# Patient Record
Sex: Male | Born: 1965 | Race: Black or African American | Hispanic: No | Marital: Married | Smoking: Never smoker
Health system: Southern US, Community
[De-identification: ages and names within clinical notes are randomized; demographics above are authoritative.]

## PROBLEM LIST (undated history)

## (undated) DIAGNOSIS — Z789 Other specified health status: Secondary | ICD-10-CM

## (undated) HISTORY — PX: KNEE SURGERY: SHX244

---

## 2002-10-13 ENCOUNTER — Encounter: Payer: Self-pay | Admitting: Orthopedic Surgery

## 2002-10-13 ENCOUNTER — Encounter (HOSPITAL_COMMUNITY): Admission: RE | Admit: 2002-10-13 | Discharge: 2002-11-12 | Payer: Self-pay | Admitting: Orthopedic Surgery

## 2006-11-04 ENCOUNTER — Emergency Department (HOSPITAL_COMMUNITY): Admission: EM | Admit: 2006-11-04 | Discharge: 2006-11-04 | Payer: Self-pay | Admitting: Emergency Medicine

## 2011-12-24 DIAGNOSIS — Z87828 Personal history of other (healed) physical injury and trauma: Secondary | ICD-10-CM | POA: Insufficient documentation

## 2011-12-24 DIAGNOSIS — E669 Obesity, unspecified: Secondary | ICD-10-CM | POA: Insufficient documentation

## 2011-12-24 DIAGNOSIS — R030 Elevated blood-pressure reading, without diagnosis of hypertension: Secondary | ICD-10-CM | POA: Insufficient documentation

## 2011-12-24 DIAGNOSIS — N529 Male erectile dysfunction, unspecified: Secondary | ICD-10-CM | POA: Insufficient documentation

## 2013-10-13 ENCOUNTER — Emergency Department (HOSPITAL_COMMUNITY): Payer: Medicare Other

## 2013-10-13 DIAGNOSIS — I959 Hypotension, unspecified: Secondary | ICD-10-CM | POA: Insufficient documentation

## 2013-10-13 DIAGNOSIS — E871 Hypo-osmolality and hyponatremia: Secondary | ICD-10-CM | POA: Insufficient documentation

## 2013-10-13 DIAGNOSIS — N179 Acute kidney failure, unspecified: Secondary | ICD-10-CM | POA: Insufficient documentation

## 2013-10-13 DIAGNOSIS — B9789 Other viral agents as the cause of diseases classified elsewhere: Principal | ICD-10-CM | POA: Insufficient documentation

## 2013-10-13 LAB — CBC
HCT: 42.8 % (ref 39.0–52.0)
HEMOGLOBIN: 15.1 g/dL (ref 13.0–17.0)
MCH: 34 pg (ref 26.0–34.0)
MCHC: 35.3 g/dL (ref 30.0–36.0)
MCV: 96.4 fL (ref 78.0–100.0)
Platelets: 177 10*3/uL (ref 150–400)
RBC: 4.44 MIL/uL (ref 4.22–5.81)
RDW: 13.3 % (ref 11.5–15.5)
WBC: 6.3 10*3/uL (ref 4.0–10.5)

## 2013-10-13 LAB — BASIC METABOLIC PANEL
BUN: 12 mg/dL (ref 6–23)
CALCIUM: 9.1 mg/dL (ref 8.4–10.5)
CO2: 21 mEq/L (ref 19–32)
Chloride: 92 mEq/L — ABNORMAL LOW (ref 96–112)
Creatinine, Ser: 1.41 mg/dL — ABNORMAL HIGH (ref 0.50–1.35)
GFR calc Af Amer: 66 mL/min — ABNORMAL LOW (ref >90)
GFR, EST NON AFRICAN AMERICAN: 57 mL/min — AB (ref >90)
GLUCOSE: 100 mg/dL — AB (ref 70–99)
Potassium: 5.1 mEq/L (ref 3.7–5.3)
SODIUM: 131 meq/L — AB (ref 137–147)

## 2013-10-13 NOTE — ED Notes (Signed)
PT started feeling bad and reports coughing up stuff.  Pt states he took some old antibiotics, got better and then took Benadryl to sleep and reports taking Mucinex and Aleve at the same time.  Pt states he feels lethargic, rash to chest, and red in face, achy all over, and feels sob

## 2013-10-14 ENCOUNTER — Encounter (HOSPITAL_COMMUNITY): Payer: Self-pay | Admitting: Emergency Medicine

## 2013-10-14 ENCOUNTER — Observation Stay (HOSPITAL_COMMUNITY)
Admission: EM | Admit: 2013-10-14 | Discharge: 2013-10-15 | Disposition: A | Discharge status: Home / Self Care | Payer: Medicare Other | Attending: Internal Medicine | Admitting: Internal Medicine

## 2013-10-14 DIAGNOSIS — B349 Viral infection, unspecified: Secondary | ICD-10-CM | POA: Diagnosis present

## 2013-10-14 DIAGNOSIS — E871 Hypo-osmolality and hyponatremia: Secondary | ICD-10-CM

## 2013-10-14 DIAGNOSIS — B9789 Other viral agents as the cause of diseases classified elsewhere: Principal | ICD-10-CM

## 2013-10-14 DIAGNOSIS — I959 Hypotension, unspecified: Secondary | ICD-10-CM

## 2013-10-14 DIAGNOSIS — E860 Dehydration: Secondary | ICD-10-CM

## 2013-10-14 DIAGNOSIS — N179 Acute kidney failure, unspecified: Secondary | ICD-10-CM

## 2013-10-14 HISTORY — DX: Other specified health status: Z78.9

## 2013-10-14 LAB — URINALYSIS, ROUTINE W REFLEX MICROSCOPIC
Bilirubin Urine: NEGATIVE
Glucose, UA: NEGATIVE mg/dL
Hgb urine dipstick: NEGATIVE
Ketones, ur: NEGATIVE mg/dL
Leukocytes, UA: NEGATIVE
Nitrite: NEGATIVE
Protein, ur: 30 mg/dL — AB
Specific Gravity, Urine: 1.024 (ref 1.005–1.030)
Urobilinogen, UA: 0.2 mg/dL (ref 0.0–1.0)
pH: 6.5 (ref 5.0–8.0)

## 2013-10-14 LAB — INFLUENZA PANEL BY PCR (TYPE A & B)
H1N1FLUPCR: NOT DETECTED
INFLBPCR: NEGATIVE
Influenza A By PCR: NEGATIVE

## 2013-10-14 LAB — I-STAT CG4 LACTIC ACID, ED: LACTIC ACID, VENOUS: 0.72 mmol/L (ref 0.5–2.2)

## 2013-10-14 LAB — URINE MICROSCOPIC-ADD ON

## 2013-10-14 LAB — CBC
HCT: 42.3 % (ref 39.0–52.0)
Hemoglobin: 14.7 g/dL (ref 13.0–17.0)
MCH: 33.8 pg (ref 26.0–34.0)
MCHC: 34.8 g/dL (ref 30.0–36.0)
MCV: 97.2 fL (ref 78.0–100.0)
Platelets: 159 10*3/uL (ref 150–400)
RBC: 4.35 MIL/uL (ref 4.22–5.81)
RDW: 13.3 % (ref 11.5–15.5)
WBC: 4 10*3/uL (ref 4.0–10.5)

## 2013-10-14 LAB — CREATININE, SERUM
Creatinine, Ser: 1.3 mg/dL (ref 0.50–1.35)
GFR calc Af Amer: 73 mL/min — ABNORMAL LOW (ref >90)
GFR calc non Af Amer: 63 mL/min — ABNORMAL LOW (ref >90)

## 2013-10-14 MED ORDER — SODIUM CHLORIDE 0.9 % IV BOLUS (SEPSIS)
1000.0000 mL | Freq: Once | INTRAVENOUS | Status: AC
  Administered 2013-10-14: 1000 mL via INTRAVENOUS

## 2013-10-14 MED ORDER — ENOXAPARIN SODIUM 40 MG/0.4ML ~~LOC~~ SOLN
40.0000 mg | SUBCUTANEOUS | Status: DC
  Filled 2013-10-14 (×2): qty 0.4

## 2013-10-14 MED ORDER — ACETAMINOPHEN 650 MG RE SUPP: 650.0000 mg | Freq: Four times a day (QID) | RECTAL | Status: DC | PRN

## 2013-10-14 MED ORDER — ONDANSETRON HCL 4 MG PO TABS: 4.0000 mg | ORAL_TABLET | Freq: Four times a day (QID) | ORAL | Status: DC | PRN

## 2013-10-14 MED ORDER — SODIUM CHLORIDE 0.9 % IV SOLN
INTRAVENOUS | Status: DC
  Administered 2013-10-14: 20:00:00 via INTRAVENOUS

## 2013-10-14 MED ORDER — ALUM & MAG HYDROXIDE-SIMETH 200-200-20 MG/5ML PO SUSP: 30.0000 mL | Freq: Four times a day (QID) | ORAL | Status: DC | PRN

## 2013-10-14 MED ORDER — ACETAMINOPHEN 325 MG PO TABS: 650.0000 mg | ORAL_TABLET | Freq: Four times a day (QID) | ORAL | Status: DC | PRN

## 2013-10-14 MED ORDER — IBUPROFEN 200 MG PO TABS
600.0000 mg | ORAL_TABLET | Freq: Once | ORAL | Status: AC
  Administered 2013-10-14: 600 mg via ORAL
  Filled 2013-10-14: qty 3

## 2013-10-14 MED ORDER — DIPHENHYDRAMINE HCL 25 MG PO CAPS
25.0000 mg | ORAL_CAPSULE | Freq: Once | ORAL | Status: AC
  Administered 2013-10-14: 25 mg via ORAL
  Filled 2013-10-14: qty 1

## 2013-10-14 MED ORDER — OXYCODONE HCL 5 MG PO TABS
5.0000 mg | ORAL_TABLET | ORAL | Status: DC | PRN
  Administered 2013-10-14: 5 mg via ORAL
  Filled 2013-10-14: qty 1

## 2013-10-14 MED ORDER — ONDANSETRON HCL 4 MG/2ML IJ SOLN: 4.0000 mg | Freq: Four times a day (QID) | INTRAMUSCULAR | Status: DC | PRN

## 2013-10-14 NOTE — ED Notes (Signed)
Pt moved to Pod C. Report given to [place]erm[place]y, [place]

## 2013-10-14 NOTE — H&P (Signed)
[facility] History and Physical  Darren MaeWilliam H Faller ZOX:[id number] DOB: May 20, 1966 DOA: 10/14/2013  Referring physician:  PCP: No primary provider on file.   Chief Complaint: Generalized weakness  HPI: Darren Watson is a 48 y.o. male with no significant past medical history presenting to the emergency room with complaints of generalized weakness, low-grade temperature, chills, malaise, fatigue, dizziness/lightheadedness, cough, mild shortness of breath. He also complains of nonbloody diarrhea and mild generalized abdominal pain. mptoms have been present over the past week for which he took some "old antibiotics." He states feeling worse despite taking antimicrobial therapy. He also reports developing a rash over his face and trunk after taking antibiotics for which she took Benadryl. Patient cannot recall the name of the antibiotic. Workup in the emergency department included a chest x-ray which showed minimal right basilar atelectasis, no infiltrate or effusion noted. Labs revealed the presence of acute renal failure with creatinine 1.4 as well a sodium of 131. Lactic acid at 0.72 with CBC showing white count of 6300. He reports several recent trips taking his son to football games, although denies sick contacts.                                                                           Review of Systems:  Constitutional:  No weight loss, night sweats, positive for fevers, chills, fatigue.  HEENT:  No headaches, Difficulty swallowing,Tooth/dental problems,Sore throat,  No sneezing, itching, ear ache, nasal congestion, post nasal drip,  Cardio-vascular:  No chest pain, Orthopnea, PND, swelling in lower extremities, anasarca, dizziness, palpitations  GI:  No heartburn, indigestion, abdominal pain, nausea, vomiting, diarrhea, change in bowel habits, loss of appetite  Resp:  Positive for shortness of breath with exertion or at rest. No excess mucus, no productive cough, positive for  non-productive cough, No coughing up of blood.No change in color of mucus.No wheezing.No chest wall deformity  Skin:  no rash or lesions.  GU:  no dysuria, change in color of urine, no urgency or frequency. No flank pain.  Musculoskeletal:  No joint pain or swelling. No decreased range of motion. No back pain.  Psych:  No change in mood or affect. No depression or anxiety. No memory loss.   No past medical history on file. No past surgical history on file. Social History:  has no tobacco, alcohol, and drug history on file.  No Known Allergies  No family history on file.   Prior to Admission medications   Medication Sig Start Date End Date Taking? Authorizing Provider  diphenhydrAMINE (BENADRYL) 25 MG tablet Take 25 mg by mouth every 6 (six) hours as needed.   Yes Historical Provider, MD  naproxen sodium (ANAPROX) 220 MG tablet Take 220 mg by mouth daily as needed (for pain).   Yes Historical Provider, MD  Phenylephrine-DM-GG-APAP (MUCINEX FAST-MAX) 10-20-400-650 MG PACK Take 30 mLs by mouth daily as needed (for cold).   Yes Historical Provider, MD   Physical Exam: Filed Vitals:   10/14/13 0800  BP: 102/70  Pulse: 83  Temp:   Resp:     BP 102/70  Pulse 83  Temp(Src) 99.3 F (37.4 C) (Oral)  Resp 18  Wt 95.255 kg (210 lb)  SpO2 97%  General:  Ill-appearing, in no acute distress  Eyes: PERRL, normal lids, irises & conjunctiva ENT: grossly normal hearing, dry oral mucosa Neck: no LAD, masses or thyromegaly Cardiovascular: RRR, no m/r/g. No LE edema. Telemetry: SR, no arrhythmias  Respiratory: CTA bilaterally, no w/r/r. Normal respiratory effort. Abdomen: soft, ntnd Skin: no rash or induration seen on limited exam Musculoskeletal: grossly normal tone BUE/BLE Psychiatric: grossly normal mood and affect, speech fluent and appropriate Neurologic: grossly non-focal.          Labs on Admission:  Basic Metabolic Panel:  Recent Labs Lab 10/13/13 2121  NA 131*  K 5.1   CL 92*  CO2 21  GLUCOSE 100*  BUN 12  CREATININE 1.41*  CALCIUM 9.1   Liver Function Tests: No results found for this basename: AST, ALT, ALKPHOS, BILITOT, PROT, ALBUMIN,  in the last 168 hours No results found for this basename: LIPASE, AMYLASE,  in the last 168 hours No results found for this basename: AMMONIA,  in the last 168 hours CBC:  Recent Labs Lab 10/13/13 2121  WBC 6.3  HGB 15.1  HCT 42.8  MCV 96.4  PLT 177   Cardiac Enzymes: No results found for this basename: CKTOTAL, CKMB, CKMBINDEX, TROPONINI,  in the last 168 hours  BNP (last 3 results) No results found for this basename: PROBNP,  in the last 8760 hours CBG: No results found for this basename: GLUCAP,  in the last 168 hours  Radiological Exams on Admission: Dg Chest 2 View  10/13/2013   CLINICAL DATA:  Shortness of breath  EXAM: CHEST  2 VIEW  COMPARISON:  None.  FINDINGS: Cardiac shadow is within normal limits. Minimal right basilar atelectasis is noted. No sizable infiltrate or effusion is noted. No bony abnormality is seen.  IMPRESSION: Right basilar atelectasis.   Electronically Signed   By: Alcide CleverMark  Lukens M.D.   On: 10/13/2013 21:56    EKG: Independently reviewed.   Assessment/Plan Principal Problem:   Viral illness Active Problems:   Dehydration   Hypotension   AKI (acute kidney injury)   Hyponatremia   1. Suspected viral illness. Patient presenting with clinical signs symptoms suggestive of viral illness. Initial chest x-ray unremarkable, having a normal white count and lactic acid level. Patient appearing dehydrated, will place him in overnight observation and hydrate him with IV fluids. Will check blood cultures and flu swab. Provide supportive care, repeat lab work in a.m. 2. Hypotension. In the emergency room patient having low blood pressures of 103/42, I suspect secondary to dehydration. Providing IV fluid resuscitation. Monitor vitals.  3. Acute kidney injury. Lab work showing a  creatinine of 1.41, likely secondary to prerenal azotemia. Hydrate with normal saline 4. Hyponatremia. Patient's having a sodium of 131 on initial lab work, likely secondary to hypotonic, hypovolemic, hyponatremia. Receiving IV fluids 5. DVT prophylaxis. Subcutaneous Lovenox  Code Status: Full code Family Communication:  Disposition Plan: Admitting patient to office, I do not anticipate him requiring greater than 2 nights hospitalization  Time spent: 55 minutes  Jeralyn BennettEzequiel Zeyna Mkrtchyan MD [facility] Pager [phone]

## 2013-10-14 NOTE — ED Notes (Signed)
Pt reports he does not feel any better.

## 2013-10-14 NOTE — ED Notes (Signed)
Pt asked multiple questions:  "why do I hurt?"  "can the dr give me something for a cough?"  "why am I chilled?"  Education given.

## 2013-10-14 NOTE — ED Provider Notes (Signed)
CSN: [id number]     Arrival date & time 10/13/13  2045 History   First MD Initiated Contact with Patient 10/14/13 0035     Chief Complaint  Patient presents with  . Fever  . Rash    itchy ? Hives     (Consider location/radiation/quality/duration/timing/severity/associated sxs/prior Treatment) HPI Patient has had several days of generalized fatigue, nonproductive cough and fever. He states he's been taking some old antibiotics. Over the last day he has developed a diffuse red rash to face and trunk. He has some mild shortness of breath. He continues to have a low-grade fever. He states he just feels sick. He taking the next and Aleve at home. He denies any focal weakness or numbness. He has no neck stiffness. He has no abdominal pain. He has no nausea, vomiting or diarrhea. He denies any urinary symptoms. No past medical history on file. No past surgical history on file. No family history on file. History  Substance Use Topics  . Smoking status: Not on file  . Smokeless tobacco: Not on file  . Alcohol Use: Not on file    Review of Systems  Constitutional: Positive for fever, chills and fatigue.  HENT: Negative for congestion, rhinorrhea and sore throat.   Respiratory: Positive for cough and shortness of breath.   Cardiovascular: Negative for chest pain, palpitations and leg swelling.  Gastrointestinal: Negative for nausea, vomiting and abdominal pain.  Genitourinary: Negative for dysuria.  Musculoskeletal: Negative for back pain, myalgias, neck pain and neck stiffness.  Skin: Positive for rash. Negative for wound.  Neurological: Negative for dizziness, weakness, light-headedness, numbness and headaches.  All other systems reviewed and are negative.     Allergies  Review of patient's allergies indicates no known allergies.  Home Medications   Current Outpatient Rx  Name  Route  Sig  Dispense  Refill  . diphenhydrAMINE (BENADRYL) 25 MG tablet   Oral   Take 25 mg by mouth  every 6 (six) hours as needed.         . naproxen sodium (ANAPROX) 220 MG tablet   Oral   Take 220 mg by mouth daily as needed (for pain).         . Phenylephrine-DM-GG-APAP (MUCINEX FAST-MAX) 10-20-400-650 MG PACK   Oral   Take 30 mLs by mouth daily as needed (for cold).          BP 100/40  Pulse 81  Temp(Src) 99.8 F (37.7 C) (Oral)  Resp 18  Wt 210 lb (95.255 kg)  SpO2 96% Physical Exam  Nursing note and vitals reviewed. Constitutional: He is oriented to person, place, and time. He appears well-developed and well-nourished. No distress.  HENT:  Head: Normocephalic and atraumatic.  Mouth/Throat: Oropharynx is clear and moist.  Patient has several posterior scalp nodules. There is no tenderness, fluctuance or warmth.  Eyes: EOM are normal. Pupils are equal, round, and reactive to light.  Neck: Normal range of motion. Neck supple.  No meningismus  Cardiovascular: Normal rate and regular rhythm.   Pulmonary/Chest: Effort normal and breath sounds normal. No respiratory distress. He has no wheezes. He has no rales. He exhibits no tenderness.  Abdominal: Soft. Bowel sounds are normal. He exhibits no distension and no mass. There is no tenderness. There is no rebound and no guarding.  Musculoskeletal: Normal range of motion. He exhibits no edema and no tenderness.  Neurological: He is alert and oriented to person, place, and time.  Patient is alert and oriented x3 with  clear, goal oriented speech. Patient has 5/5 motor in all extremities. Sensation is intact to light touch. Bilateral finger-to-nose is normal with no signs of dysmetria. Patient has a normal gait and walks without assistance.   Skin: Skin is warm and dry. Rash noted. No erythema.  Patient has fine lacelike macular erythematous rash covering trauma and extremities.  Psychiatric: He has a normal mood and affect. His behavior is normal.    ED Course  Procedures (including critical care time) Labs Review Labs  Reviewed  BASIC METABOLIC PANEL - Abnormal; Notable for the following:    Sodium 131 (*)    Chloride 92 (*)    Glucose, Bld 100 (*)    Creatinine, Ser 1.41 (*)    GFR calc non Af Amer 57 (*)    GFR calc Af Amer 66 (*)    All other components within normal limits  CBC  URINALYSIS, ROUTINE W REFLEX MICROSCOPIC   Imaging Review Dg Chest 2 View  10/13/2013   CLINICAL DATA:  Shortness of breath  EXAM: CHEST  2 VIEW  COMPARISON:  None.  FINDINGS: Cardiac shadow is within normal limits. Minimal right basilar atelectasis is noted. No sizable infiltrate or effusion is noted. No bony abnormality is seen.  IMPRESSION: Right basilar atelectasis.   Electronically Signed   By: Alcide CleverMark  Lukens M.D.   On: 10/13/2013 21:56     EKG Interpretation None      MDM   Final diagnoses:  None   patient continues to have borderline hypotension despite IV fluids. Continues to have mild lightheadedness when sitting or standing. His lactic acid is normal he has a normal white blood cell count. I suspect his symptoms are likely related to some type of viral infection. I discussed with Dr. Dawayne Patriciafeet of [facility]. She will see the patient in emergency department and assist with disposition.      Loren Raceravid Mikinzie Maciejewski, MD 10/14/13 [phone]

## 2013-10-14 NOTE — Progress Notes (Signed)
Darren MaeWilliam H Strohecker [id number] Code Status: FULL  Admission Data: 10/14/2013 6:05 PM Attending Provider: Vanessa BarbaraZamora PCP:No primary provider on file. Consults/ Treatment Team:  [facility]  Darren Watson is a 48 y.o. male patient admitted from ED awake, alert - oriented  X 3 - no acute distress noted.  VSS - Blood pressure 113/61, pulse 79, temperature 99.2 F (37.3 C), temperature source Oral, resp. rate 18, height 5\' 9"  (1.753 m), weight 95.482 kg (210 lb 8 oz), SpO2 100.00%.  no c/o shortness of breath, no c/o chest pain.  IV Fluids:  IV in place, occlusive dsg intact without redness normal saline.  Allergies:  No Known Allergies   History reviewed. No pertinent past medical history. Medications Prior to Admission  Medication Sig Dispense Refill  . diphenhydrAMINE (BENADRYL) 25 MG tablet Take 25 mg by mouth every 6 (six) hours as needed.      . naproxen sodium (ANAPROX) 220 MG tablet Take 220 mg by mouth daily as needed (for pain).      . Phenylephrine-DM-GG-APAP (MUCINEX FAST-MAX) 10-20-400-650 MG PACK Take 30 mLs by mouth daily as needed (for cold).       History:  obtained from the patient.  Orientation to room, and floor completed with information packet given to patient/family.  Patient declined safety video at this time.  Admission INP armband ID verified with patient/family, and in place.   SR up x 2, fall assessment complete, with patient and family able to verbalize understanding of risk associated with falls, and verbalized understanding to call nsg before up out of bed.  Call light within reach, patient able to voice, and demonstrate understanding.  Skin, clean-dry- intact without evidence of bruising, or skin tears.   No evidence of skin break down noted on exam.     Will cont to eval and treat per MD orders.  Aldean AstRachel C Lesperance, RN 10/14/2013 6:05 PM

## 2013-10-14 NOTE — Progress Notes (Signed)
4:56 PM Attempted to receive report  1705: Report received from ED RN

## 2013-10-15 LAB — BASIC METABOLIC PANEL
BUN: 10 mg/dL (ref 6–23)
CHLORIDE: 98 meq/L (ref 96–112)
CO2: 24 meq/L (ref 19–32)
Calcium: 8.3 mg/dL — ABNORMAL LOW (ref 8.4–10.5)
Creatinine, Ser: 1.24 mg/dL (ref 0.50–1.35)
GFR calc Af Amer: 77 mL/min — ABNORMAL LOW (ref >90)
GFR calc non Af Amer: 67 mL/min — ABNORMAL LOW (ref >90)
GLUCOSE: 97 mg/dL (ref 70–99)
Potassium: 4.6 mEq/L (ref 3.7–5.3)
SODIUM: 133 meq/L — AB (ref 137–147)

## 2013-10-15 LAB — CBC
HEMATOCRIT: 39.8 % (ref 39.0–52.0)
HEMOGLOBIN: 13.6 g/dL (ref 13.0–17.0)
MCH: 33.7 pg (ref 26.0–34.0)
MCHC: 34.2 g/dL (ref 30.0–36.0)
MCV: 98.5 fL (ref 78.0–100.0)
Platelets: 154 10*3/uL (ref 150–400)
RBC: 4.04 MIL/uL — AB (ref 4.22–5.81)
RDW: 13.4 % (ref 11.5–15.5)
WBC: 4.3 10*3/uL (ref 4.0–10.5)

## 2013-10-15 MED ORDER — FLUTICASONE PROPIONATE 50 MCG/ACT NA SUSP: 1.0000 | Freq: Every day | NASAL | Status: DC

## 2013-10-15 MED ORDER — ACETAMINOPHEN 325 MG PO TABS: 650.0000 mg | ORAL_TABLET | Freq: Four times a day (QID) | ORAL | Status: DC | PRN

## 2013-10-15 NOTE — Discharge Summary (Signed)
Addendum  Patient seen and examined, chart and data base reviewed.  I agree with the above assessment and plan.  For full details please see Mrs. Algis DownsMarianne York PA note.  Viral syndrome and acute renal failure which is resolved after hydration.   Clint LippsMutaz A Sharley Keeler, MD [facility] Pager: [phone] 10/15/2013, 5:59 PM

## 2013-10-15 NOTE — Discharge Instructions (Signed)
Establish with a primary care physician. Avoid NSAIDS (Naproxsyn, Advil, Ibuprofen).  Use Tylenol instead.

## 2013-10-15 NOTE — Progress Notes (Signed)
Nsg Discharge Note  Admit Date:  10/14/2013 Discharge date: 10/15/2013   Darren Watson to be D/C'd Home per MD order.  AVS completed.  Copy for chart, and copy for patient signed, and dated. Patient/caregiver able to verbalize understanding.  Discharge Medication:   Medication List    STOP taking these medications       naproxen sodium 220 MG tablet  Commonly known as:  ANAPROX      TAKE these medications       acetaminophen 325 MG tablet  Commonly known as:  TYLENOL  Take 2 tablets (650 mg total) by mouth every 6 (six) hours as needed for mild pain (or Fever >/= 101).     diphenhydrAMINE 25 MG tablet  Commonly known as:  BENADRYL  Take 25 mg by mouth every 6 (six) hours as needed.     fluticasone 50 MCG/ACT nasal spray  Commonly known as:  FLONASE  Place 1 spray into both nostrils daily.     MUCINEX FAST-MAX 10-20-400-650 MG Pack  Generic drug:  Phenylephrine-DM-GG-APAP  Take 30 mLs by mouth daily as needed (for cold).        Discharge Assessment: Filed Vitals:   10/15/13 1439  BP: 123/71  Pulse: 79  Temp: 98.3 F (36.8 C)  Resp: 20   Skin clean, dry and intact without evidence of skin break down, no evidence of skin tears noted. IV catheter discontinued intact. Site without signs and symptoms of complications - no redness or edema noted at insertion site, patient denies c/o pain - only slight tenderness at site.  Dressing with slight pressure applied.  D/c Instructions-Education: Discharge instructions given to patient/family with verbalized understanding. D/c education completed with patient/family including follow up instructions, medication list, d/c activities limitations if indicated, with other d/c instructions as indicated by MD - patient able to verbalize understanding, all questions fully answered. Patient instructed to return to ED, call 911, or call MD for any changes in condition.  Patient escorted via WC, and D/C home via private auto.  Darren Watson  Darren Jelinski, RN 10/15/2013 4:18 PM

## 2013-10-15 NOTE — Discharge Summary (Signed)
Physician Discharge Summary  Darren MaeWilliam H Kratochvil ZOX:[id number] DOB: 06-23-1966 DOA: 10/14/2013  PCP: No primary provider on file[facility]  Admit date: 10/14/2013 Discharge date: 10/15/2013  Time spent: 45 minutes  Recommendations for Outpatient Follow-up:  Follow up with a primary care physician as needed.   Discharge Diagnoses:  Principal Problem:   Viral illness Active Problems:   Dehydration   Hypotension   AKI (acute kidney injury)   Hyponatremia   Discharge Condition: stable.  Diet recommendation: heart healthy  Filed Weights   10/13/13 2118 10/14/13 1749  Weight: 95.255 kg (210 lb) 95.482 kg (210 lb 8 oz)    History of present illness:   Darren Watson is a 48 y.o. male with no significant past medical history presenting to the emergency room with complaints of generalized weakness, low-grade temperature, chills, malaise, fatigue, dizziness/lightheadedness, cough, mild shortness of breath. He also complains of nonbloody diarrhea and mild generalized abdominal pain. symptoms have been present over the past week for which he took some "old antibiotics.". He also reports developing a rash over his face and trunk after taking antibiotics for which she took Benadryl. Patient cannot recall the name of the antibiotic. Workup in the emergency department included a chest x-ray which showed minimal right basilar atelectasis, no infiltrate or effusion noted. Labs revealed the presence of acute renal failure with creatinine 1.4 as well a sodium of 131.   He reports several recent trips taking his son to football games, although denies sick contacts.   Hospital Course:   Acute renal failure Likely secondary to viral syndrome  Discontinued NSAIDS U/A is negative for infection IV Fluids Creatinine has normalized after receiving IVF.  Mild hyponatremia Trending up with oral diet and IVF.  Viral Syndrome Influenza PCR is negative Vital signs stable. No elevation of  WBC Eating well.  No complaints of body aches or malaise.  Discharge Exam: Filed Vitals:   10/15/13 0607  BP: 108/69  Pulse: 71  Temp: 98.8 F (37.1 C)  Resp: 20    General: Alert and Orientated, WN, WD, Appears well ENT:  MMM, No exudates or erythema in oropharynx Neck:  Supple no Lymphadenopathy Cardiovascular: RRR no M/R/G Respiratory: CTA no W/C/R Abdomen:  Soft, Nt, Nd, +Bs, no masses Extremities:  No C/C/E, ambulating about the room Skin:  No rash.        Discharge Orders   Future Orders Complete By Expires   Diet - low sodium heart healthy  As directed    Increase activity slowly  As directed        Medication List    STOP taking these medications       naproxen sodium 220 MG tablet  Commonly known as:  ANAPROX      TAKE these medications       acetaminophen 325 MG tablet  Commonly known as:  TYLENOL  Take 2 tablets (650 mg total) by mouth every 6 (six) hours as needed for mild pain (or Fever >/= 101).     diphenhydrAMINE 25 MG tablet  Commonly known as:  BENADRYL  Take 25 mg by mouth every 6 (six) hours as needed.     fluticasone 50 MCG/ACT nasal spray  Commonly known as:  FLONASE  Place 1 spray into both nostrils daily.     MUCINEX FAST-MAX 10-20-400-650 MG Pack  Generic drug:  Phenylephrine-DM-GG-APAP  Take 30 mLs by mouth daily as needed (for cold).       No Known Allergies Follow-up  Information   Please follow up. (FOLLOW UP WITH PRIMARY CARE PHYSICIAN AS NEEDED.)        The results of significant diagnostics from this hospitalization (including imaging, microbiology, ancillary and laboratory) are listed below for reference.    Significant Diagnostic Studies: Dg Chest 2 View  10/13/2013   CLINICAL DATA:  Shortness of breath  EXAM: CHEST  2 VIEW  COMPARISON:  None.  FINDINGS: Cardiac shadow is within normal limits. Minimal right basilar atelectasis is noted. No sizable infiltrate or effusion is noted. No bony abnormality is seen.   IMPRESSION: Right basilar atelectasis.   Electronically Signed   By: Alcide CleverMark  Lukens M.D.   On: 10/13/2013 21:56    Microbiology: Recent Results (from the past 240 hour(s))  CULTURE, BLOOD (ROUTINE X 2)     Status: None   Collection Time    10/14/13 11:22 AM      Result Value Ref Range Status   Specimen Description BLOOD LEFT ANTECUBITAL   Final   Special Requests BOTTLES DRAWN AEROBIC AND ANAEROBIC 5CCS   Final   Culture  Setup Time     Final   Value: 10/14/2013 16:17     [facility]   Culture     Final   Value:        BLOOD CULTURE RECEIVED NO GROWTH TO DATE CULTURE WILL BE HELD FOR 5 DAYS BEFORE ISSUING A FINAL NEGATIVE REPORT     Performed at [facility]   Report Status PENDING   Incomplete  CULTURE, BLOOD (ROUTINE X 2)     Status: None   Collection Time    10/14/13 11:29 AM      Result Value Ref Range Status   Specimen Description BLOOD LEFT HAND   Final   Special Requests BOTTLES DRAWN AEROBIC AND ANAEROBIC 5CCS   Final   Culture  Setup Time     Final   Value: 10/14/2013 16:18     Performed at [facility]   Culture     Final   Value:        BLOOD CULTURE RECEIVED NO GROWTH TO DATE CULTURE WILL BE HELD FOR 5 DAYS BEFORE ISSUING A FINAL NEGATIVE REPORT     Performed at [facility]   Report Status PENDING   Incomplete     Labs: Basic Metabolic Panel:  Recent Labs Lab 10/13/13 2121 10/14/13 1122 10/15/13 0624  NA 131*  --  133*  K 5.1  --  4.6  CL 92*  --  98  CO2 21  --  24  GLUCOSE 100*  --  97  BUN 12  --  10  CREATININE 1.41* 1.30 1.24  CALCIUM 9.1  --  8.3*   CBC:  Recent Labs Lab 10/13/13 2121 10/14/13 1122 10/15/13 0624  WBC 6.3 4.0 4.3  HGB 15.1 14.7 13.6  HCT 42.8 42.3 39.8  MCV 96.4 97.2 98.5  PLT 177 159 154     Signed:  Conley CanalMarianne L York, PA-C [phone]  [facility] 10/15/2013, 2:00 PM

## 2013-10-20 LAB — CULTURE, BLOOD (ROUTINE X 2)
CULTURE: NO GROWTH
Culture: NO GROWTH

## 2016-02-15 DIAGNOSIS — M25511 Pain in right shoulder: Secondary | ICD-10-CM | POA: Insufficient documentation

## 2016-02-25 DIAGNOSIS — S46219A Strain of muscle, fascia and tendon of other parts of biceps, unspecified arm, initial encounter: Secondary | ICD-10-CM | POA: Insufficient documentation

## 2016-11-02 DIAGNOSIS — M25522 Pain in left elbow: Secondary | ICD-10-CM | POA: Insufficient documentation

## 2017-01-04 DIAGNOSIS — M779 Enthesopathy, unspecified: Secondary | ICD-10-CM | POA: Insufficient documentation

## 2017-10-23 ENCOUNTER — Emergency Department (HOSPITAL_COMMUNITY): Payer: Medicare Other

## 2017-10-23 ENCOUNTER — Emergency Department (HOSPITAL_COMMUNITY)
Admission: EM | Admit: 2017-10-23 | Discharge: 2017-10-23 | Disposition: A | Discharge status: Home / Self Care | Payer: Medicare Other | Attending: Emergency Medicine | Admitting: Emergency Medicine

## 2017-10-23 ENCOUNTER — Encounter (HOSPITAL_COMMUNITY): Payer: Self-pay | Admitting: Emergency Medicine

## 2017-10-23 DIAGNOSIS — S060X1A Concussion with loss of consciousness of 30 minutes or less, initial encounter: Secondary | ICD-10-CM | POA: Diagnosis not present

## 2017-10-23 DIAGNOSIS — S0990XA Unspecified injury of head, initial encounter: Secondary | ICD-10-CM | POA: Diagnosis present

## 2017-10-23 DIAGNOSIS — Y999 Unspecified external cause status: Secondary | ICD-10-CM | POA: Insufficient documentation

## 2017-10-23 DIAGNOSIS — S20222A Contusion of left back wall of thorax, initial encounter: Secondary | ICD-10-CM | POA: Diagnosis not present

## 2017-10-23 DIAGNOSIS — Y9343 Activity, gymnastics: Secondary | ICD-10-CM | POA: Insufficient documentation

## 2017-10-23 DIAGNOSIS — Y929 Unspecified place or not applicable: Secondary | ICD-10-CM | POA: Diagnosis not present

## 2017-10-23 DIAGNOSIS — W1789XA Other fall from one level to another, initial encounter: Secondary | ICD-10-CM | POA: Diagnosis not present

## 2017-10-23 LAB — CBC WITH DIFFERENTIAL/PLATELET
BASOS PCT: 0 %
Basophils Absolute: 0 10*3/uL (ref 0.0–0.1)
Eosinophils Absolute: 0 10*3/uL (ref 0.0–0.7)
Eosinophils Relative: 1 %
HCT: 41.4 % (ref 39.0–52.0)
Hemoglobin: 14.1 g/dL (ref 13.0–17.0)
Lymphocytes Relative: 25 %
Lymphs Abs: 1.9 10*3/uL (ref 0.7–4.0)
MCH: 33.3 pg (ref 26.0–34.0)
MCHC: 34.1 g/dL (ref 30.0–36.0)
MCV: 97.6 fL (ref 78.0–100.0)
Monocytes Absolute: 0.7 10*3/uL (ref 0.1–1.0)
Monocytes Relative: 9 %
NEUTROS PCT: 65 %
Neutro Abs: 5 10*3/uL (ref 1.7–7.7)
Platelets: 247 10*3/uL (ref 150–400)
RBC: 4.24 MIL/uL (ref 4.22–5.81)
RDW: 13 % (ref 11.5–15.5)
WBC: 7.6 10*3/uL (ref 4.0–10.5)

## 2017-10-23 LAB — COMPREHENSIVE METABOLIC PANEL
ALBUMIN: 3.5 g/dL (ref 3.5–5.0)
ALK PHOS: 42 U/L (ref 38–126)
ALT: 13 U/L — ABNORMAL LOW (ref 17–63)
AST: 32 U/L (ref 15–41)
Anion gap: 11 (ref 5–15)
BILIRUBIN TOTAL: 0.5 mg/dL (ref 0.3–1.2)
BUN: 13 mg/dL (ref 6–20)
CO2: 27 mmol/L (ref 22–32)
Calcium: 9.5 mg/dL (ref 8.9–10.3)
Chloride: 97 mmol/L — ABNORMAL LOW (ref 101–111)
Creatinine, Ser: 1.14 mg/dL (ref 0.61–1.24)
GFR calc Af Amer: >60 mL/min (ref >60)
GFR calc non Af Amer: >60 mL/min (ref >60)
Glucose, Bld: 100 mg/dL — ABNORMAL HIGH (ref 65–99)
Potassium: 4.9 mmol/L (ref 3.5–5.1)
Sodium: 135 mmol/L (ref 135–145)
TOTAL PROTEIN: 7.4 g/dL (ref 6.5–8.1)

## 2017-10-23 MED ORDER — SODIUM CHLORIDE 0.9 % IV BOLUS
500.0000 mL | Freq: Once | INTRAVENOUS | Status: AC
  Administered 2017-10-23: 500 mL via INTRAVENOUS

## 2017-10-23 MED ORDER — KETOROLAC TROMETHAMINE 30 MG/ML IJ SOLN
30.0000 mg | Freq: Once | INTRAMUSCULAR | Status: AC
  Administered 2017-10-23: 30 mg via INTRAVENOUS
  Filled 2017-10-23: qty 1

## 2017-10-23 MED ORDER — METHOCARBAMOL 500 MG PO TABS
1000.0000 mg | ORAL_TABLET | Freq: Once | ORAL | Status: AC
  Administered 2017-10-23: 1000 mg via ORAL
  Filled 2017-10-23: qty 2

## 2017-10-23 MED ORDER — IBUPROFEN 600 MG PO TABS: 600.0000 mg | ORAL_TABLET | Freq: Four times a day (QID) | ORAL | 0 refills | Status: DC | PRN

## 2017-10-23 MED ORDER — METHOCARBAMOL 500 MG PO TABS: 1000.0000 mg | ORAL_TABLET | Freq: Three times a day (TID) | ORAL | 0 refills | Status: DC | PRN

## 2017-10-23 NOTE — ED Triage Notes (Signed)
Pt was the gym flipping around a pull up bar falling off backwards  Hitting his head pt was pos loc pt alert and oriented on arrival

## 2017-10-23 NOTE — ED Notes (Signed)
Pt left before able to get vitals and sign the pad .

## 2017-10-23 NOTE — ED Provider Notes (Signed)
[facility]   CSN: [id number] Arrival date & time: 10/23/17  1526     History   Chief Complaint Chief Complaint  Patient presents with  . Fall    HPI Rod MaeWilliam H Rager is a 52 y.o. male.  HPI Patient is unable to recall event.  Per EMS patient was spinning around a pull up bar and fell over backwards hitting his head.  Question possible loss of consciousness.  Patient states he does not remember EMS arrival or ambulance transport.  He is complaining of left-sided thoracic back pain.  Denies headache, focal weakness or numbness. Past Medical History:  Diagnosis Date  . Medical history non-contributory     Patient Active Problem List   Diagnosis Date Noted  . Viral illness 10/14/2013  . Dehydration 10/14/2013  . Hypotension 10/14/2013  . AKI (acute kidney injury) (HCC) 10/14/2013  . Hyponatremia 10/14/2013    Past Surgical History:  Procedure Laterality Date  . KNEE SURGERY Right    [facility]        Home Medications    Prior to Admission medications   Medication Sig Start Date End Date Taking? Authorizing Provider  acetaminophen (TYLENOL) 325 MG tablet Take 2 tablets (650 mg total) by mouth every 6 (six) hours as needed for mild pain (or Fever >/= 101). Patient not taking: Reported on 10/23/2017 10/15/13   Dellinger, Tora KindredMarianne L, PA-C  ibuprofen (ADVIL,MOTRIN) 600 MG tablet Take 1 tablet (600 mg total) by mouth every 6 (six) hours as needed for moderate pain. 10/23/17   Loren RacerYelverton, Miri Jose, MD  methocarbamol (ROBAXIN) 500 MG tablet Take 2 tablets (1,000 mg total) by mouth every 8 (eight) hours as needed for muscle spasms. 10/23/17   Loren RacerYelverton, Tauriel Scronce, MD    Family History History reviewed. No pertinent family history.  Social History Social History   Tobacco Use  . Smoking status: Never Smoker  . Smokeless tobacco: Never Used  Substance Use Topics  . Alcohol use: No  . Drug use: No     Allergies   Patient has  no known allergies.   Review of Systems Review of Systems  Constitutional: Negative for chills and fever.  HENT: Negative for facial swelling and trouble swallowing.   Eyes: Negative for visual disturbance.  Respiratory: Negative for chest tightness, shortness of breath and wheezing.   Cardiovascular: Negative for chest pain and palpitations.  Gastrointestinal: Negative for abdominal pain, nausea and vomiting.  Musculoskeletal: Positive for back pain. Negative for neck pain.  Skin: Negative for rash and wound.  Neurological: Positive for syncope. Negative for dizziness, weakness, light-headedness, numbness and headaches.  Psychiatric/Behavioral: Positive for confusion.  All other systems reviewed and are negative.    Physical Exam Updated Vital Signs BP 138/66 (BP Location: Right Arm)   Pulse 63   Temp 98.2 F (36.8 C) (Oral)   Resp 17   SpO2 100%   Physical Exam  Constitutional: He appears well-developed and well-nourished. No distress.  HENT:  Head: Normocephalic and atraumatic.  Mouth/Throat: Oropharynx is clear and moist.  No obvious head trauma.  Midface stable.  No malocclusion.  No intraoral lesions.  Eyes: Pupils are equal, round, and reactive to light. EOM are normal.  Neck: Normal range of motion. Neck supple.  Cervical collar in place.  Does not appear to have any midline cervical tenderness or step-offs.  Cardiovascular: Normal rate and regular rhythm. Exam reveals no gallop and no friction rub.  No murmur heard. Pulmonary/Chest: Effort normal  and breath sounds normal. No stridor. No respiratory distress. He has no wheezes. He has no rales. He exhibits no tenderness.  Abdominal: Soft. Bowel sounds are normal. There is no tenderness. There is no rebound and no guarding.  Musculoskeletal: Normal range of motion. He exhibits tenderness. He exhibits no edema.  Patient has left sided thoracic pain over the inferior ribs.  No crepitus or deformity.  Does not appear to  have midline thoracic or lumbar tenderness.  Pelvis is stable.  Neurological: He is alert.  Oriented to person.  Amnestic to events.  Clear speech.  Slow to answer questions and follow commands.  5/5 motor in all extremities.  Sensation intact.  Skin: Skin is warm and dry. Capillary refill takes less than 2 seconds. No rash noted. He is not diaphoretic. No erythema.  Nursing note and vitals reviewed.    ED Treatments / Results  Labs (all labs ordered are listed, but only abnormal results are displayed) Labs Reviewed  COMPREHENSIVE METABOLIC PANEL - Abnormal; Notable for the following components:      Result Value   Chloride 97 (*)    Glucose, Bld 100 (*)    ALT 13 (*)    All other components within normal limits  CBC WITH DIFFERENTIAL/PLATELET    EKG EKG Interpretation  Date/Time:  Tuesday October 23 2017 16:06:36 EDT Ventricular Rate:  67 PR Interval:  198 QRS Duration: 86 QT Interval:  384 QTC Calculation: 405 R Axis:   88 Text Interpretation:  Normal sinus rhythm Normal ECG Confirmed by Loren Racer ([id number]) on 10/23/2017 5:25:32 PM   Radiology Dg Ribs Unilateral W/chest Left  Result Date: 10/23/2017 CLINICAL DATA:  Pain after fall EXAM: LEFT RIBS AND CHEST - 3+ VIEW COMPARISON:  None. FINDINGS: The heart, hila, and mediastinum are normal given positioning. No pneumothorax. No nodules or masses. No focal infiltrates. No rib fractures are noted. IMPRESSION: Negative. Electronically Signed   By: Gerome Sam III M.D   On: 10/23/2017 17:01   Ct Head Wo Contrast  Result Date: 10/23/2017 CLINICAL DATA:  Pain after trauma EXAM: CT HEAD WITHOUT CONTRAST CT CERVICAL SPINE WITHOUT CONTRAST TECHNIQUE: Multidetector CT imaging of the head and cervical spine was performed following the standard protocol without intravenous contrast. Multiplanar CT image reconstructions of the cervical spine were also generated. COMPARISON:  Nov 04, 2006 FINDINGS: CT HEAD FINDINGS Brain: No evidence  of acute infarction, hemorrhage, hydrocephalus, extra-axial collection or mass lesion/mass effect. Vascular: No hyperdense vessel or unexpected calcification. Skull: Normal. Negative for fracture or focal lesion. Sinuses/Orbits: No acute finding. Other: None. CT CERVICAL SPINE FINDINGS Alignment: Normal. Skull base and vertebrae: Irregularity of the C7 and T1 posterior spinous processes are consistent with previous fractures. These do not have an acute appearance as they are well corticated and not thought to associated with recent trauma. No other fractures are identified. Soft tissues and spinal canal: No prevertebral fluid or swelling. No visible canal hematoma. Disc levels: Posterior disc bulges at C5-6 and C6-7. Small anterior posterior osteophytes at the same levels. Upper chest: Negative. Other: No other abnormalities. IMPRESSION: 1. No acute intracranial abnormality. 2. Irregularity of the C7 and T1 posterior spinous processes are thought to be from previous trauma and not acute. 3. No other acute abnormality in the cervical spine. 4. Degenerative changes. Electronically Signed   By: Gerome Sam III M.D   On: 10/23/2017 16:59   Ct Cervical Spine Wo Contrast  Result Date: 10/23/2017 CLINICAL DATA:  Pain after trauma  EXAM: CT HEAD WITHOUT CONTRAST CT CERVICAL SPINE WITHOUT CONTRAST TECHNIQUE: Multidetector CT imaging of the head and cervical spine was performed following the standard protocol without intravenous contrast. Multiplanar CT image reconstructions of the cervical spine were also generated. COMPARISON:  Nov 04, 2006 FINDINGS: CT HEAD FINDINGS Brain: No evidence of acute infarction, hemorrhage, hydrocephalus, extra-axial collection or mass lesion/mass effect. Vascular: No hyperdense vessel or unexpected calcification. Skull: Normal. Negative for fracture or focal lesion. Sinuses/Orbits: No acute finding. Other: None. CT CERVICAL SPINE FINDINGS Alignment: Normal. Skull base and vertebrae:  Irregularity of the C7 and T1 posterior spinous processes are consistent with previous fractures. These do not have an acute appearance as they are well corticated and not thought to associated with recent trauma. No other fractures are identified. Soft tissues and spinal canal: No prevertebral fluid or swelling. No visible canal hematoma. Disc levels: Posterior disc bulges at C5-6 and C6-7. Small anterior posterior osteophytes at the same levels. Upper chest: Negative. Other: No other abnormalities. IMPRESSION: 1. No acute intracranial abnormality. 2. Irregularity of the C7 and T1 posterior spinous processes are thought to be from previous trauma and not acute. 3. No other acute abnormality in the cervical spine. 4. Degenerative changes. Electronically Signed   By: Gerome Sam III M.D   On: 10/23/2017 16:59    Procedures Procedures (including critical care time)  Medications Ordered in ED Medications  sodium chloride 0.9 % bolus 500 mL (500 mLs Intravenous New Bag/Given 10/23/17 1805)  ketorolac (TORADOL) 30 MG/ML injection 30 mg (30 mg Intravenous Given 10/23/17 1805)  methocarbamol (ROBAXIN) tablet 1,000 mg (1,000 mg Oral Given 10/23/17 1805)     Initial Impression / Assessment and Plan / ED Course  I have reviewed the triage vital signs and the nursing notes.  Pertinent labs & imaging results that were available during my care of the patient were reviewed by me and considered in my medical decision making (see chart for details).     Patient with fall and likely loss of consciousness.  He is amnestic to events.   She continues to have no posterior cervical tenderness to palpation.  Cervical collar removed. Confusion has resolved.  He is ambulatory with a normal neurologic exam.  He is been given strict concussion precautions with family present.  Advised to avoid all mental and physically strenuous activity for 1 week and only restart physical activity gradually after his symptoms have  completely resolved.  Return precautions have been given. Final Clinical Impressions(s) / ED Diagnoses   Final diagnoses:  Concussion with loss of consciousness of 30 minutes or less, initial encounter  Contusion of left back wall of thorax, initial encounter    ED Discharge Orders        Ordered    ibuprofen (ADVIL,MOTRIN) 600 MG tablet  Every 6 hours PRN     10/23/17 1849    methocarbamol (ROBAXIN) 500 MG tablet  Every 8 hours PRN     10/23/17 1849       Loren Racer, MD 10/23/17 1851

## 2018-04-30 DIAGNOSIS — L930 Discoid lupus erythematosus: Secondary | ICD-10-CM | POA: Insufficient documentation

## 2018-09-11 ENCOUNTER — Telehealth: Payer: Self-pay | Admitting: Family Medicine

## 2018-09-11 NOTE — Telephone Encounter (Signed)
Patient came in office in reference to his future appt with wallace for np. Patient stated he really needs to get in sooner due to his health issues with his "parotid gland". Patient stated he has been told hes a "ticking time bomb" by other providers. Patient stated his insurance has Earlene Plater as a 5 star provider and that is his reason for wanting to see her. Please call patient and advise.

## 2018-09-12 DIAGNOSIS — R609 Edema, unspecified: Secondary | ICD-10-CM | POA: Diagnosis not present

## 2018-09-12 DIAGNOSIS — S069X9A Unspecified intracranial injury with loss of consciousness of unspecified duration, initial encounter: Secondary | ICD-10-CM | POA: Diagnosis not present

## 2018-09-13 ENCOUNTER — Telehealth: Payer: Self-pay | Admitting: Family Medicine

## 2018-09-13 NOTE — Telephone Encounter (Signed)
Please advise anywhere you think we can put him?

## 2018-09-13 NOTE — Telephone Encounter (Signed)
Get him in. Monday.

## 2018-09-13 NOTE — Telephone Encounter (Signed)
Patient is coming at 8:20am Monday

## 2018-09-13 NOTE — Telephone Encounter (Signed)
Please reference note. Patient would like a call to discuss as he says this is life threatening and he has not heard anything back yet.   Please reference the telephone note that reflects below from 09/11/2018:  "Patient came in office in reference to his future appt with wallace for np. Patient stated he really needs to get in sooner due to his health issues with his "parotid gland". Patient stated he has been told hes a "ticking time bomb" by other providers. Patient stated his insurance has Earlene Plater as a 5 star provider and that is his reason for wanting to see her. Please call patient and advise."

## 2018-09-15 DIAGNOSIS — M47812 Spondylosis without myelopathy or radiculopathy, cervical region: Secondary | ICD-10-CM | POA: Insufficient documentation

## 2018-09-15 NOTE — Progress Notes (Signed)
Darren Watson is a 53 y.o. male is here to establish care.   Assessment and Plan:   Neurocognitive deficits Feels that his mind "just goes out." Has a video from 2019, during which time he did a strength maneuver on a pull up bar, suddenly fell and landed on his back with immediate loss of consciousness. He says that he has done this maneuver many times and that this was a great example of his mind pausing. He has a difficult time remembering how to drive his Darren Watson. He is not able to ballance his checkbook. He has loss of short-term memory.   Swelling of left parotid gland ENT notes reviewed at length. I encouraged the patient to send pictures going forward through [place] for documentation. ENT feels that this most c/w nodular fasciitis. Surgery was recommended AFTER Neurology evaluation. MRI scan recommended but patient has not followed through.   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 Patient states that he was told later that he did NOT have discoid lupus.   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 Reviewed chart. Has seen Neurology fairly recently. Per that note:   ASSESSMENT: 1. Cognitive dysfunction/memory loss. 2. Spells of decreased attentiveness. 3. Left parotiditis. 4. Discoid lupus erythematosus. 5. Left orbital blowout fracture. 6. Questionable prior closed head injuries.  PLAN: The patient's examination today is limited due to cooperation, but he appears to be oriented appropriately and his speech and conversation is certainly fluent. He appears to ambulate safely in the examination and he is able to use his electronic phone easily and repetitively. I feel his issues are primarily related to a mental health disorder.   I have asked the patient to undergo laboratory workup to look for causes of cognitive dysfunction, which have not been checked. I have  also asked the patient to proceed with the previously ordered MRI brain, I believe this is ordered by his primary or ENT. Check EEG given the history of memory loss and amnesia. Strongly consider psychiatric evaluation and neuropsychological evaluation although I doubt he would cooperate based on my interaction today. Review of his cognitive rehabiliation notes suggests the same type of behavior noted by the therapist. I have not ordered any medications. Await the above studies. At the end of the evaluation, the patient requests that he see another provider. Electronically signed by: Bernestine Amass, MD 08/02/2018 4:32 PM    Orders Placed This Encounter  Procedures  . Ambulatory referral to Neurology   Subjective:   HPI: Patient presents for new patient exam. Long history discussed today. See Assessment and Plan section for Problem Based Charting of issues discussed today.   Health Maintenance:   Health Maintenance Due  Topic Date Due  . HIV Screening  07/26/1979  . COLONOSCOPY  07/25/2014  . TETANUS/TDAP  07/03/2016  . INFLUENZA VACCINE  01/31/2018   No flowsheet data found. PMHx, SurgHx, SocialHx, FamHx, Medications, and Allergies were reviewed in the Visit Navigator and updated as appropriate.   Patient Active Problem List   Diagnosis Date Noted  . History of orbital floor (blow-out) closed fracture (HCC) 09/24/2018  . Neurocognitive deficits 09/19/2018  . 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 Swelling of left parotid gland 09/19/2018  . 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 Discoid lupus erythematosus 04/30/2018  . Enthesopathy 01/04/2017  . Biceps rupture, distal 02/25/2016  . Erectile dysfunction, uses prn Revatio 12/24/2011  . History of  tear of ACL (anterior cruciate ligament) 12/24/2011   Social History   Tobacco Use  . Smoking status: Never Smoker  . Smokeless tobacco: Never Used  Substance Use Topics  . Alcohol use: No  . Drug use: No    Current Medications and Allergies:   .  None  No Known Allergies   Review of Systems   Pertinent items are noted in the HPI. Otherwise, ROS is negative.  Vitals:   Vitals:   09/16/18 0828  BP: 110/82  Pulse: (!) 110  Temp: 98.4 F (36.9 C)  TempSrc: Oral  SpO2: 98%  Weight: 172 lb 6.4 oz (78.2 kg)  Height: 5\' 8"  (1.727 m)     Body mass index is 26.21 kg/m.   Physical Exam:   General: Cooperative, alert and oriented, well developed, well nourished, in no acute distress. HEENT: Pupils equal round reactive light and extraocular movements intact. Conjunctivae and lids unremarkable. No pallor or cyanosis, dentition good. Salivary Glands: Parotid and submandibular glands normal bilaterally. Scar tissue along the left posterior facial skin overlying the parotid gland, mobile with respect to the gland, 5 x 2 cm Cardiovascular: Regular rhythm. No murmurs appreciated.  Lungs: Normal work of breathing. Clear bilaterally without rales, rhonchi, or wheezing.  Abdomen: Soft, nontender, no masses. Normal bowel sounds. Extremities: No clubbing, cyanosis, erythema. No edema.  Skin: Warm and dry. Neurologic: No focal deficits.  Psychiatric: Normal affect and thought content.   . Reviewed expectations re: course of current medical issues. . Discussed self-management of symptoms. . Outlined signs and symptoms indicating need for more acute intervention. . Patient verbalized understanding and all questions were answered. Marland Kitchen Health Maintenance issues including appropriate healthy diet, exercise, and smoking avoidance were discussed with patient. . See orders for this visit as documented in the electronic medical record. . Patient received an After Visit Summary.  Helane Rima, DO [place] 09/24/2018  Records requested if needed. Time spent with the patient: 45 minutes, of which >50% was spent in obtaining information about his symptoms, reviewing his previous labs,  evaluations, and treatments, counseling him about his condition (please see the discussed topics above), and developing a plan to further investigate it; he had a number of questions which I addressed.

## 2018-09-16 ENCOUNTER — Other Ambulatory Visit: Payer: Self-pay

## 2018-09-16 ENCOUNTER — Ambulatory Visit (INDEPENDENT_AMBULATORY_CARE_PROVIDER_SITE_OTHER): Payer: Medicare Other | Admitting: Family Medicine

## 2018-09-16 ENCOUNTER — Encounter: Payer: Self-pay | Admitting: Family Medicine

## 2018-09-16 VITALS — BP 110/82 | HR 110 | Temp 98.4°F | Ht 68.0 in | Wt 172.4 lb

## 2018-09-16 DIAGNOSIS — M47812 Spondylosis without myelopathy or radiculopathy, cervical region: Secondary | ICD-10-CM

## 2018-09-16 DIAGNOSIS — R609 Edema, unspecified: Secondary | ICD-10-CM | POA: Diagnosis not present

## 2018-09-16 DIAGNOSIS — S0230XA Fracture of orbital floor, unspecified side, initial encounter for closed fracture: Secondary | ICD-10-CM

## 2018-09-16 DIAGNOSIS — F0781 Postconcussional syndrome: Secondary | ICD-10-CM

## 2018-09-16 DIAGNOSIS — R4189 Other symptoms and signs involving cognitive functions and awareness: Secondary | ICD-10-CM

## 2018-09-16 DIAGNOSIS — R600 Localized edema: Secondary | ICD-10-CM

## 2018-09-16 DIAGNOSIS — L930 Discoid lupus erythematosus: Secondary | ICD-10-CM

## 2018-09-16 DIAGNOSIS — R29818 Other symptoms and signs involving the nervous system: Secondary | ICD-10-CM

## 2018-09-19 ENCOUNTER — Encounter: Payer: Self-pay | Admitting: Family Medicine

## 2018-09-19 ENCOUNTER — Telehealth: Payer: Self-pay | Admitting: Family Medicine

## 2018-09-19 DIAGNOSIS — F0781 Postconcussional syndrome: Secondary | ICD-10-CM | POA: Insufficient documentation

## 2018-09-19 DIAGNOSIS — R609 Edema, unspecified: Secondary | ICD-10-CM

## 2018-09-19 DIAGNOSIS — R29818 Other symptoms and signs involving the nervous system: Secondary | ICD-10-CM | POA: Insufficient documentation

## 2018-09-19 DIAGNOSIS — R600 Localized edema: Secondary | ICD-10-CM | POA: Insufficient documentation

## 2018-09-19 DIAGNOSIS — R4189 Other symptoms and signs involving cognitive functions and awareness: Principal | ICD-10-CM

## 2018-09-19 NOTE — Telephone Encounter (Signed)
Okay x 1. Please let him know that I have been working on his chart/case today but [facility] records have not been coming through. Just a glitch that I am hoping resolves today. I also received his mychart uploads and thank him for those.

## 2018-09-19 NOTE — Telephone Encounter (Signed)
Dr. Earlene Plater, pt requesting refill for Cipro and Clindamycin. I do not see where you have filled these before. Please advise.

## 2018-09-19 NOTE — Telephone Encounter (Signed)
MEDICATION:  ciprofloxacin (CIPRO) 750 MG tablet clindamycin (CLEOCIN) 150 MG capsule PHARMACY:   [facility] [address], [place] - [address] [phone] (Phone) [phone] (Fax)    IS THIS A 90 DAY SUPPLY : Y if possible   IS PATIENT OUT OF MEDICATION: Y  IF NOT; HOW MUCH IS LEFT: N/A  LAST APPOINTMENT DATE: @3 /16/2020  NEXT APPOINTMENT DATE:@7 /20/2020  OTHER COMMENTS:    **Let patient know to contact pharmacy at the end of the day to make sure medication is ready. **  ** Please notify patient to allow 48-72 hours to process**  **Encourage patient to contact the pharmacy for refills or they can request refills through [facility]**

## 2018-09-19 NOTE — Assessment & Plan Note (Signed)
Feels that his mind "just goes out." Has a video from 2019, during which time he did a strength maneuver on a pull up bar, suddenly fell and landed on his back with immediate loss of consciousness. He says that he has done this maneuver many times and that this was a great example of his mind pausing. He has a difficult time remembering how to drive his [name]. He is not able to ballance his checkbook. He has loss of short-term memory.

## 2018-09-19 NOTE — Telephone Encounter (Signed)
Should he be taking both one tab daily?

## 2018-09-19 NOTE — Telephone Encounter (Signed)
Please advise 

## 2018-09-20 MED ORDER — DICLOFENAC SODIUM 1 % TD GEL: 2.0000 g | Freq: Four times a day (QID) | TRANSDERMAL | 2 refills | Status: DC

## 2018-09-20 MED ORDER — CLINDAMYCIN HCL 150 MG PO CAPS: 450.0000 mg | ORAL_CAPSULE | Freq: Three times a day (TID) | ORAL | 0 refills | Status: DC

## 2018-09-20 MED ORDER — CIPROFLOXACIN HCL 750 MG PO TABS: 750.0000 mg | ORAL_TABLET | Freq: Two times a day (BID) | ORAL | 0 refills | Status: DC

## 2018-09-20 NOTE — Telephone Encounter (Signed)
Spoke to pt told him Rx for Cipro and Clindamycin was sent to pharmacy. Pt verbalized understanding and said he would also like something to apply topically to his joints, having a lot of joint pain. Put pt on hold discussed with Dr. Earlene Plater. Told pt we can send in Diclofenac gel which you can apply to your joints and also Dr. Earlene Plater said to get OTC Lidocaine Patches 4% at the store. Pt verbalized understanding.

## 2018-09-20 NOTE — Telephone Encounter (Signed)
Call pharmacy - refill per previous instructions.

## 2018-09-20 NOTE — Addendum Note (Signed)
Addended by: Jimmye Norman on: 09/20/2018 11:08 AM   Modules accepted: Orders

## 2018-09-22 ENCOUNTER — Encounter: Payer: Self-pay | Admitting: Family Medicine

## 2018-09-22 NOTE — Assessment & Plan Note (Signed)
Diagnosed by biopsy 2/09 and then followed by dermatology, with high positive ANA, positive dsDNA. Patient states that he was told later that he did NOT have discoid lupus.

## 2018-09-22 NOTE — Telephone Encounter (Signed)
Added to open meds

## 2018-09-22 NOTE — Assessment & Plan Note (Addendum)
Suspected diagnosis. Reviewed chart. Has seen Neurology fairly recently. Per that note:   ASSESSMENT: 1. Cognitive dysfunction/memory loss. 2. Spells of decreased attentiveness. 3. Left parotiditis. 4. Discoid lupus erythematosus. 5. Left orbital blowout fracture. 6. Questionable prior closed head injuries.  PLAN: The patient's examination today is limited due to cooperation, but he appears to be oriented appropriately and his speech and conversation is certainly fluent. He appears to ambulate safely in the examination and he is able to use his electronic phone easily and repetitively. I feel his issues are primarily related to a mental health disorder.   I have asked the patient to undergo laboratory workup to look for causes of cognitive dysfunction, which have not been checked. I have also asked the patient to proceed with the previously ordered MRI brain, I believe this is ordered by his primary or ENT. Check EEG given the history of memory loss and amnesia. Strongly consider psychiatric evaluation and neuropsychological evaluation although I doubt he would cooperate based on my interaction today. Review of his cognitive rehabiliation notes suggests the same type of behavior noted by the therapist. I have not ordered any medications. Await the above studies. At the end of the evaluation, the patient requests that he see another provider. Electronically signed by: Bernestine Amass, MD 08/02/2018 4:32 PM

## 2018-09-22 NOTE — Assessment & Plan Note (Signed)
ENT notes reviewed at length. I encouraged the patient to send pictures going forward through [place] for documentation. ENT feels that this most c/w nodular fasciitis. Surgery was recommended AFTER Neurology evaluation. MRI scan recommended but patient has not followed through.

## 2018-09-24 ENCOUNTER — Encounter: Payer: Self-pay | Admitting: Neurology

## 2018-09-24 ENCOUNTER — Encounter: Payer: Self-pay | Admitting: Family Medicine

## 2018-09-24 DIAGNOSIS — S0230XA Fracture of orbital floor, unspecified side, initial encounter for closed fracture: Secondary | ICD-10-CM | POA: Insufficient documentation

## 2018-09-25 ENCOUNTER — Encounter: Payer: Self-pay | Admitting: Family Medicine

## 2018-09-27 NOTE — Telephone Encounter (Signed)
Spoke to patient let him know that referral to neurosurgeon was declined. But they did recommend referral to [facility]. They recommended doctor and we are working of getting that referral paced for him. Patient was instructed this is a requirement set forth by ENT in order to proceed with surgery.

## 2018-10-01 ENCOUNTER — Telehealth: Payer: Self-pay | Admitting: Neurology

## 2018-10-01 NOTE — Telephone Encounter (Signed)
Patient was notified by our office that after review of his recent Neurology evaluation from January 2020, I don't think we can offer anything further for this patient than what has already been discussed with him by his neurologist. It appears what he needs is Neuropsychological testing. This information was relayed to referring MD.

## 2018-10-03 ENCOUNTER — Ambulatory Visit: Payer: Self-pay | Admitting: *Deleted

## 2018-10-03 DIAGNOSIS — R600 Localized edema: Secondary | ICD-10-CM

## 2018-10-03 DIAGNOSIS — F0781 Postconcussional syndrome: Secondary | ICD-10-CM

## 2018-10-03 DIAGNOSIS — R29818 Other symptoms and signs involving the nervous system: Secondary | ICD-10-CM

## 2018-10-03 DIAGNOSIS — L930 Discoid lupus erythematosus: Secondary | ICD-10-CM

## 2018-10-03 DIAGNOSIS — R609 Edema, unspecified: Secondary | ICD-10-CM

## 2018-10-03 DIAGNOSIS — R4189 Other symptoms and signs involving cognitive functions and awareness: Secondary | ICD-10-CM

## 2018-10-04 NOTE — Telephone Encounter (Signed)
See note

## 2018-10-04 NOTE — Telephone Encounter (Signed)
Please advise 

## 2018-10-04 NOTE — Telephone Encounter (Signed)
Please note-This is a late entry. Called received after hours on Thursday, April 2'20. Call assessed as non urgent and no triage performed.  Mr. Daffern requested another Neurology referral. 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 Viewing patient's history I could see he has had nneurology and ent visits along with neuro rehab visits. He is not sick at this time so no triage performed. But he feels the infection that was in his neck may be returning. No specific symptoms just that he had had issues with an infection having to do with this parotid problem for going on 2 years. His purpose for calling was to ask an additional neurology consult because the latest ENT suggested that. He may be reached at the number on file. Routing to PCP for further contact/information for the patient. Reason for Disposition . [1] Caller requesting NON-URGENT health information AND [2] PCP's office is the best resource  Answer Assessment - Initial Assessment Questions 1. REASON FOR CALL or QUESTION: "What is your reason for calling today?" or "How can I best help you?" or "What question do you have that I can help answer?"     Patient requesting a new neurologist referral.  Protocols used: INFORMATION ONLY CALL-A-AH

## 2018-10-06 NOTE — Telephone Encounter (Signed)
I put in referral but Neurology declined to see the patient since he has been evaluated already. He will need to go back to the previous Neurologist to be cleared for surgery.

## 2018-10-08 ENCOUNTER — Telehealth: Payer: Self-pay | Admitting: Family Medicine

## 2018-10-08 NOTE — Telephone Encounter (Signed)
This encounter was created in error - please disregard.

## 2018-10-08 NOTE — Telephone Encounter (Signed)
Spoke to pt and he is concerned about getting to see a neurosurgeon for removal of the scar tissue around his ear and his balance. Pt expressed frustration that he has been on antibiotics for 2 years. Pt stated that he feels that his providers are more concerned about a cognitive decline rather than the scar tissue an infection. He stated he needs to have direction on what to do. Pt became emotional during call.

## 2018-10-09 NOTE — Telephone Encounter (Signed)
This has been addressed multiple times - see previous messages. Please let me know when he has been called and previous instructions discussed.

## 2018-10-09 NOTE — Telephone Encounter (Signed)
Called pt, he advised that he doesn't need a referral to neurology, he needs referral to neurosurgery to look at the infection and scar tissue. OK to place referral to neurosurgery?

## 2018-10-09 NOTE — Telephone Encounter (Signed)
Referral placed to Neurosurgery

## 2018-10-24 ENCOUNTER — Telehealth: Payer: Self-pay | Admitting: Family Medicine

## 2018-10-24 NOTE — Telephone Encounter (Signed)
Ok to fill 

## 2018-10-24 NOTE — Telephone Encounter (Signed)
See note

## 2018-10-24 NOTE — Telephone Encounter (Signed)
Please advise have some questions on this.

## 2018-10-24 NOTE — Telephone Encounter (Signed)
Patient called back to say that he is still awaiting some news about the Neurology consult stated that every time he hears from a Neurology office they have the wrong information. Patient ststes that he is waiting for a call back please Ph# [phone]

## 2018-10-24 NOTE — Telephone Encounter (Signed)
See request °

## 2018-10-24 NOTE — Telephone Encounter (Signed)
Copied from CRM [id number]. Topic: Quick Communication - Rx Refill/Question >> Oct 24, 2018  3:25 PM Jaquita Rector A wrote: Medication: clindamycin (CLEOCIN) 150 MG capsule, ciprofloxacin (CIPRO) 750 MG tablet, cyclobenzaprine (FLEXERIL) 10 MG tablet, ibuprofen (ADVIL,MOTRIN) 600 MG tablet ( asking for 800 mg )  Patient ask for a call back when Rx are sent  Has the patient contacted their pharmacy? Yes.   (Agent: If no, request that the patient contact the pharmacy for the refill.) (Agent: If yes, when and what did the pharmacy advise?)  Preferred Pharmacy (with phone number or street name): [facility] [address] [place] - [address] [phone] (Phone) [phone] (Fax)    Agent: Please be advised that RX refills may take up to 3 business days. We ask that you follow-up with your pharmacy.

## 2018-10-25 NOTE — Telephone Encounter (Signed)
This encounter was created in error - please disregard.

## 2018-10-25 NOTE — Telephone Encounter (Signed)
I think that we need to discuss with a family member as I worried that he is not processing the information clearly. See if someone on DPR.

## 2018-10-27 ENCOUNTER — Other Ambulatory Visit: Payer: Self-pay | Admitting: Family Medicine

## 2018-10-28 ENCOUNTER — Telehealth: Payer: Self-pay | Admitting: Family Medicine

## 2018-10-28 NOTE — Telephone Encounter (Signed)
Called pt and left VM to call the office.  

## 2018-10-28 NOTE — Telephone Encounter (Signed)
Let's set up visit - 40 minutes - ask for family member to be there as well.

## 2018-10-28 NOTE — Telephone Encounter (Signed)
 [facility] Night - [place] TELEPHONE ADVICE RECORD [facility] Call [facility] Patient Name: Darren Watson Gender: Male DOB: [birth year]  Age: 52 Y 3 M 3 D Return Phone Number: [phone] (Primary) Address:  City/State/Zip: [place]  [facility]istic[facility] [employer] Type Call Who Is Calling Patient / Member / Family / Caregiver Call Type Triage / Clinical Relationship To Patient Self Return Phone Number [phone] (Primary) Chief Complaint Prescription Refill or Medication Request (non symptomatic) Reason for Call Symptomatic / Request for Health Information Initial Comment He is needing his medication called in. Was told the nurses would be unable to call in the medication but he refused and still wanted to speak to a nurse. Translation No Nurse Assessment Nurse: Tillman Abide, RN, Debra Date/Time (Eastern Time): 10/27/2018 6:03:05 PM Confirm and document reason for call. If symptomatic, describe symptoms. ---caller states is needing his medication called in. Was told the nurses would be unable to call in the medication but he refused and still wanted to speak to a nurse. 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 thinks was misdiagnosed. not healing - has had numerous doctors. nobody seems to know what is going on. rx is cipro, clindamycin. is out of the cipro. Has the patient had close contact with a person known or suspected to have the novel coronavirus illness OR traveled / lives in area with major community spread (including international travel) in the last 14 days from the onset of symptoms? * If Asymptomatic, screen for exposure and travel within the last 14 days. ---Not Applicable Does the patient have any new or worsening symptoms? ---No Please document clinical information provided and list any resource used. ---advised per client directives that we  are unable to send scripts afterhours. advised that i will send message to the office regarding rx. offered information regarding [place] connect, sent by email. advised thay may be able to send in rx for limited amt of abx to get him thru until he is able to contact office. verbalizes understanding. Guidelines Guideline Title Affirmed Question Affirmed Notes Nurse Date/Time (Eastern Time) Disp. Time Lamount Cohen Time) Disposition Final User 10/27/2018 6:16:29 PM Clinical Call Yes Tillman Abide, RN, Stanton Kidney PLEASE NOTE:  All timestamps contained within this report are represented as [place] Standard Time. CONFIDENTIALTY NOTICE: This fax transmission is intended only for the addressee.  It contains information that is legally privileged, confidential or otherwise protected from use or disclosure.  If you are not the intended recipient, you are strictly prohibited from reviewing, disclosing, copying using or disseminating any of this information or taking any action in reliance on or regarding this information.  If you have received this fax in error, please notify us immediately by telephone so that we can arrange for its return to [place]. Phone:  [phone], Toll-Free:  [phone], Fax:  [phone] Page: 2 of 2 Call Id: [id number] After Care Instructions Given Call Event Type User Date / Time Description Education document email Rolly Pancake 10/27/2018 6:16:12 PM [facility] Now Instructions

## 2018-10-28 NOTE — Telephone Encounter (Signed)
There is no one listed on DPR. Called pt earlier and left VM to call the office.

## 2018-10-28 NOTE — Telephone Encounter (Signed)
Medication Refill   You  [facility] 1 minute ago (8:26 AM)      Called pt and left VM to call the office.       Documentation     Helane Rima, DO  [facility] 31 minutes ago (7:57 AM)      Let's set up visit - 40 minutes - ask for family member to be there as well.       Documentation     Interface, Surescripts Out  [facility] Refill 18 hours ago (2:21 PM)    Request received via interface.   Routing comment

## 2018-10-29 NOTE — Telephone Encounter (Signed)
See open message

## 2018-10-30 NOTE — Telephone Encounter (Signed)
Left voice message to call clinic 

## 2018-11-07 NOTE — Telephone Encounter (Signed)
I called and scheduled the patient for 11/15/18 at 11:20. Patient was adamant he did not need an appointment because he was already seen. Please contact patient to advise if needed. I blocked additional time due to the visit and the urgency stated by the patient.

## 2018-11-07 NOTE — Telephone Encounter (Signed)
Please call patient to set up an appointment.

## 2018-11-11 NOTE — Telephone Encounter (Signed)
Noted  

## 2018-11-14 NOTE — Progress Notes (Deleted)
Virtual Visit via Video   Due to the COVID-19 pandemic, this visit was completed with telemedicine (audio/video) technology to reduce patient and provider exposure as well as to preserve personal protective equipment.   I connected with Darren Watson by a video enabled telemedicine application and verified that I am speaking with the correct person using two identifiers. Location patient: Home Location provider: [facility], Office Persons participating in the virtual visit: Darren ParmaWilliam H Watson, Mashanda Ishibashi, DO   I discussed the limitations of evaluation and management by telemedicine and the availability of in person appointments. The patient expressed understanding and agreed to proceed.  Care Team   Patient Care Team: Helane RimaWallace, Kaysea Raya, DO as PCP - General (Family Medicine)  Subjective:   HPI:   ROS   Patient Active Problem List   Diagnosis Date Noted  . History of orbital floor (blow-out) closed fracture (HCC) 09/24/2018  . Neurocognitive deficits 09/19/2018  . 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 Swelling of left parotid gland 09/19/2018  . 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 Discoid lupus erythematosus 04/30/2018  . Enthesopathy 01/04/2017  . Biceps rupture, distal 02/25/2016  . Erectile dysfunction, uses prn Revatio 12/24/2011  . History of tear of ACL (anterior cruciate ligament) 12/24/2011    Social History   Tobacco Use  . Smoking status: Never Smoker  . Smokeless tobacco: Never Used  Substance Use Topics  . Alcohol use: No    Current Outpatient Medications:  .  ciprofloxacin (CIPRO) 750 MG tablet, Take 1 tablet by mouth twice daily, Disp: 60 tablet, Rfl: 0 .  clindamycin (CLEOCIN) 150 MG capsule, TAKE 3 CAPSULES BY MOUTH EVERY 8 HOURS, Disp: 270 capsule, Rfl: 0 .  diclofenac sodium (VOLTAREN) 1 % GEL, Apply 2 g topically 4 (four) times daily., Disp: 1 Tube, Rfl: 2 .  ibuprofen (ADVIL,MOTRIN) 600 MG tablet, Take 1 tablet (600 mg total)  by mouth every 6 (six) hours as needed for moderate pain., Disp: 30 tablet, Rfl: 0 .  sildenafil (REVATIO) 20 MG tablet, TAKE 1 TABLET BY MOUTH ONCE DAILY AS NEEDED AS DIRECTED FOR ERECTILE DYSFUNCTION, Disp: , Rfl:   No Known Allergies  Objective:   VITALS: Per patient if applicable, see vitals. GENERAL: Alert, appears well and in no acute distress. HEENT: Atraumatic, conjunctiva clear, no obvious abnormalities on inspection of external nose and ears. NECK: Normal movements of the head and neck. CARDIOPULMONARY: No increased WOB. Speaking in clear sentences. I:E ratio WNL.  MS: Moves all visible extremities without noticeable abnormality. PSYCH: Pleasant and cooperative, well-groomed. Speech normal rate and rhythm. Affect is appropriate. Insight and judgement are appropriate. Attention is focused, linear, and appropriate.  NEURO: CN grossly intact. Oriented as arrived to appointment on time with no prompting. Moves both UE equally.  SKIN: No obvious lesions, wounds, erythema, or cyanosis noted on face or hands.  No flowsheet data found.  Assessment and Plan:   There are no diagnoses linked to this encounter.  Marland Kitchen. COVID-19 Education: The signs and symptoms of COVID-19 were discussed with the patient and how to seek care for testing if needed. The importance of social distancing was discussed today. . Reviewed expectations re: course of current medical issues. . Discussed self-management of symptoms. . Outlined signs and symptoms indicating need for more acute intervention. . Patient verbalized understanding and all questions were answered. Marland Kitchen. Health Maintenance issues including appropriate healthy diet, exercise, and smoking avoidance were discussed with patient. . See orders for this visit as documented  in the electronic medical record.  Helane Rima, DO  Records requested if needed. Time spent: *** minutes, of which >50% was spent in obtaining information about his symptoms,  reviewing his previous labs, evaluations, and treatments, counseling him about his condition (please see the discussed topics above), and developing a plan to further investigate it; he had a number of questions which I addressed.

## 2018-11-15 ENCOUNTER — Ambulatory Visit: Payer: Medicare Other | Admitting: Family Medicine

## 2018-11-15 ENCOUNTER — Telehealth: Payer: Self-pay | Admitting: Family Medicine

## 2018-11-15 ENCOUNTER — Encounter

## 2018-11-15 NOTE — Telephone Encounter (Signed)
Pt stated he was by the phone for his doxy appt and it never rang. Pt would like to have home health sent to his home. I informed pt I would send high priority message and have a nurse give him a call. Please advise.

## 2018-11-15 NOTE — Telephone Encounter (Signed)
We did call patient to make sure he was going to keep app also link was sent by [place] x 2. Pt will need to r/s app in slot and make sure he will have family member come in with him. This should be an office visit.

## 2018-11-18 NOTE — Telephone Encounter (Signed)
Called pt and scheduled for 5/20. Pt stated he would have someone come with him.

## 2018-11-20 ENCOUNTER — Ambulatory Visit: Payer: Medicare Other | Admitting: Family Medicine

## 2018-11-20 ENCOUNTER — Other Ambulatory Visit: Payer: Self-pay

## 2018-11-20 ENCOUNTER — Encounter: Payer: Self-pay | Admitting: Physician Assistant

## 2018-11-20 ENCOUNTER — Ambulatory Visit (INDEPENDENT_AMBULATORY_CARE_PROVIDER_SITE_OTHER): Payer: Medicare Other | Admitting: Physician Assistant

## 2018-11-20 VITALS — BP 146/80 | HR 86 | Temp 98.3°F | Ht 68.0 in | Wt 174.0 lb

## 2018-11-20 DIAGNOSIS — R030 Elevated blood-pressure reading, without diagnosis of hypertension: Secondary | ICD-10-CM

## 2018-11-20 DIAGNOSIS — R600 Localized edema: Secondary | ICD-10-CM

## 2018-11-20 DIAGNOSIS — R4189 Other symptoms and signs involving cognitive functions and awareness: Secondary | ICD-10-CM

## 2018-11-20 DIAGNOSIS — R29818 Other symptoms and signs involving the nervous system: Secondary | ICD-10-CM

## 2018-11-20 DIAGNOSIS — R609 Edema, unspecified: Secondary | ICD-10-CM | POA: Diagnosis not present

## 2018-11-20 MED ORDER — CIPROFLOXACIN HCL 750 MG PO TABS: 750.0000 mg | ORAL_TABLET | Freq: Two times a day (BID) | ORAL | 0 refills | Status: DC

## 2018-11-20 MED ORDER — CLINDAMYCIN HCL 150 MG PO CAPS: ORAL_CAPSULE | ORAL | 0 refills | Status: DC

## 2018-11-20 NOTE — Progress Notes (Signed)
Darren Watson is a 53 y.o. male here for a follow up of a pre-existing problem.   History of Present Illness:   Chief Complaint  Patient presents with  . Home Health referral    HPI   Notes extensively reviewed from PCP, last office visit with Dr. Helane Rima was on 09/16/2018.  Darren Watson is present with his mother today.  Swelling of L parotid gland/Neurocognitive deficits  Continues on cipro and clindamycin. Per chart review, he has had multiple ENT encounters. He states that he has only had one ENT tell him that he needs surgery. He is reluctant to have this surgery because he feels like he wasn't able to really discuss the details of the surgery and possible outcomes. He verbalizes understanding to me that he needs a neurologist to clear him for surgery. Per chart review, his neurologist, Dr. Leanna Watson, has recommended further work-up including labs, MRI and EEG but this has not been done. Patient remains very reluctant on pursuing these tests. Neuro cognitive deficits persist (discussed in PCP office note on 3/16) and he would like home health to see if this will help his quality of life.  Elevated blood pressure Currently not on any medications. At home blood pressure readings are no checked. Patient denies chest pain, SOB, blurred vision, dizziness, unusual headaches, lower leg swelling. Denies excessive caffeine intake, stimulant usage, excessive alcohol intake, or increase in salt consumption.  BP Readings from Last 3 Encounters:  11/20/18 (!) 146/80  09/16/18 110/82  10/23/17 138/66    History reviewed. No pertinent past medical history.   Social History   Socioeconomic History  . Marital status: Married    Spouse name: Not on file  . Number of children: Not on file  . Years of education: Not on file  . Highest education level: Not on file  Occupational History  . Not on file  Social Needs  . Financial resource strain: Not on file  . Food insecurity:    Worry: Not  on file    Inability: Not on file  . Transportation needs:    Medical: Not on file    Non-medical: Not on file  Tobacco Use  . Smoking status: Never Smoker  . Smokeless tobacco: Never Used  Substance and Sexual Activity  . Alcohol use: No  . Drug use: No  . Sexual activity: Not on file  Lifestyle  . Physical activity:    Days per week: Not on file    Minutes per session: Not on file  . Stress: Not on file  Relationships  . Social connections:    Talks on phone: Not on file    Gets together: Not on file    Attends religious service: Not on file    Active member of club or organization: Not on file    Attends meetings of clubs or organizations: Not on file    Relationship status: Not on file  . Intimate partner violence:    Fear of current or ex partner: Not on file    Emotionally abused: Not on file    Physically abused: Not on file    Forced sexual activity: Not on file  Other Topics Concern  . Not on file  Social History Narrative   Denies tobacco, drug use. 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 Hx of workups have included HIV, all negative. No exogenous testosterone use. Take a  B complex. Works at [employer]. Lives [place] his [place] farm, "been in my family since early 8s." Dad was in World War, so he was taught not to complain and does "what has to be done." With parotid issues, stopped wanting meat. Down 70 pounds in 2 years.     Past Surgical History:  Procedure Laterality Date  . KNEE SURGERY Right     History reviewed. No pertinent family history.  No Known Allergies  Current Medications:   Current Outpatient Medications:  .  ciprofloxacin (CIPRO) 750 MG tablet, Take 1 tablet (750 mg total) by mouth 2 (two) times daily., Disp: 60 tablet, Rfl: 0 .  clindamycin (CLEOCIN) 150 MG capsule, TAKE 3 CAPSULES BY MOUTH EVERY 8 HOURS, Disp: 270 capsule, Rfl: 0 .  diclofenac  sodium (VOLTAREN) 1 % GEL, Apply 2 g topically 4 (four) times daily., Disp: 1 Tube, Rfl: 2 .  ibuprofen (ADVIL,MOTRIN) 600 MG tablet, Take 1 tablet (600 mg total) by mouth every 6 (six) hours as needed for moderate pain., Disp: 30 tablet, Rfl: 0 .  sildenafil (REVATIO) 20 MG tablet, TAKE 1 TABLET BY MOUTH ONCE DAILY AS NEEDED AS DIRECTED FOR ERECTILE DYSFUNCTION, Disp: , Rfl:    Review of Systems:   ROS  Negative unless otherwise specified per HPI.   Vitals:   Vitals:   11/20/18 0900 11/20/18 0955 11/20/18 0959  BP: (!) 160/100 (!) 170/110 (!) 146/80  Pulse: 84 86   Temp: 98.3 F (36.8 C)    TempSrc: Oral    SpO2: 99%    Weight: 174 lb (78.9 kg)    Height: 5\' 8"  (1.727 m)       Body mass index is 26.46 kg/m.  Physical Exam:   Physical Exam Vitals signs and nursing note reviewed.  Constitutional:      General: He is not in acute distress.    Appearance: He is well-developed. He is not ill-appearing or toxic-appearing.  Neck:     Comments: Salivary Glands: Parotid and submandibular glands normal bilaterally. Scar tissue along the left posterior facial skin overlying the parotid gland, mobile with respect to the gland, 5 x 2 cm Cardiovascular:     Rate and Rhythm: Normal rate and regular rhythm.     Pulses: Normal pulses.     Heart sounds: Normal heart sounds, S1 normal and S2 normal.     Comments: No LE edema Pulmonary:     Effort: Pulmonary effort is normal.     Breath sounds: Normal breath sounds.  Skin:    General: Skin is warm and dry.  Neurological:     Mental Status: He is alert.     GCS: GCS eye subscore is 4. GCS verbal subscore is 5. GCS motor subscore is 6.  Psychiatric:        Speech: Speech normal.        Behavior: Behavior normal. Behavior is cooperative.     Assessment and Plan:   [place] was seen today for home health referral.  Diagnoses and all orders for this visit:  Neurocognitive deficits Referral to Home Health per PCP.  Swelling of  left parotid gland Patient is very reluctant to having surgery, doesn't feel knowledgeable about the surgery and is confused why some ENTs are not recommending surgery and another one is. Per chart review, surgery is recommended and I discussed this with him at length. I also recommended that he reach out to the surgeon's office who is recommending the surgery and schedule an appointment  to gain more information and answer any questions he may have. I recommended that in the meantime he write down his questions so he can be prepared for the appointment. I also recommended that his mother, or other trusted individual accompany him, to help gain more information. I did refill his abx x 1 month, and told patient that this will be the last refill from our office, all other refills need to come from ENT. Patient verbalized understanding.  Elevated blood pressure reading BP improved by end of visit. Will ask HH RN to also monitor BP for us to help us determine if further evaluation is warranted.  Other orders -     clindamycin (CLEOCIN) 150 MG capsule; TAKE 3 CAPSULES BY MOUTH EVERY 8 HOURS -     ciprofloxacin (CIPRO) 750 MG tablet; Take 1 tablet (750 mg total) by mouth 2 (two) times daily.  . Reviewed expectations re: course of current medical issues. . Discussed self-management of symptoms. . Outlined signs and symptoms indicating need for more acute intervention. . Patient verbalized understanding and all questions were answered. . See orders for this visit as documented in the electronic medical record. . Patient received an After-Visit Summary.  CMA or LPN served as scribe during this visit. History, Physical, and Plan performed by medical provider. The above documentation has been reviewed and is accurate and complete.  I spent 55 minutes with this patient, greater than 50% was face-to-face time counseling regarding the above diagnoses.   Jarold MottoSamantha Peityn Payton, PA-C

## 2018-11-20 NOTE — Patient Instructions (Addendum)
Please call the ENT that is recommending surgery and see if you can schedule an appointment to discuss the procedure with him to gain better understanding and trust.  After this, you will need to see a neurologist to clear you for surgery.  Home Health orders will be signed later today and you should be contacted soon about this  I have refilled your antibiotics for only ONE MONTH. Per Dr. Earlene Plater all further refills will need to come from the ENT.

## 2018-11-20 NOTE — Progress Notes (Deleted)
Darren Watson is a 53 y.o. male is here to discuss: Home health referral  I acted as a Neurosurgeon for [employer], PA-C Corky Mull, LPN  History of Present Illness:   Chief Complaint  Patient presents with  . Home Health referral    HPI Pt is here today for Home Health referral to help him do tasks around the house.  Health Maintenance Due  Topic Date Due  . HIV Screening  07/26/1979  . COLONOSCOPY  07/25/2014  . TETANUS/TDAP  07/03/2016    History reviewed. No pertinent past medical history.   Social History   Socioeconomic History  . Marital status: Married    Spouse name: Not on file  . Number of children: Not on file  . Years of education: Not on file  . Highest education level: Not on file  Occupational History  . Not on file  Social Needs  . Financial resource strain: Not on file  . Food insecurity:    Worry: Not on file    Inability: Not on file  . Transportation needs:    Medical: Not on file    Non-medical: Not on file  Tobacco Use  . Smoking status: Never Smoker  . Smokeless tobacco: Never Used  Substance and Sexual Activity  . Alcohol use: No  . Drug use: No  . Sexual activity: Not on file  Lifestyle  . Physical activity:    Days per week: Not on file    Minutes per session: Not on file  . Stress: Not on file  Relationships  . Social connections:    Talks on phone: Not on file    Gets together: Not on file    Attends religious service: Not on file    Active member of club or organization: Not on file    Attends meetings of clubs or organizations: Not on file    Relationship status: Not on file  . Intimate partner violence:    Fear of current or ex partner: Not on file    Emotionally abused: Not on file    Physically abused: Not on file    Forced sexual activity: Not on file  Other Topics Concern  . Not on file  Social History Narrative   Denies tobacco, drug use. 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 Hx of workups have included HIV, all negative. No exogenous testosterone use. Take a B complex. Works at [employer]. Lives on his [place]'s farm, "been in my family since early 61s." Dad was in World War, so he was taught not to complain and does "what has to be done." With parotid issues, stopped wanting meat. Down 70 pounds in 2 years.     Past Surgical History:  Procedure Laterality Date  . KNEE SURGERY Right     History reviewed. No pertinent family history.  PMHx, SurgHx, SocialHx, FamHx, Medications, and Allergies were reviewed in the Visit Navigator and updated as appropriate.   Patient Active Problem List   Diagnosis Date Noted  . History of orbital floor (blow-out) closed fracture (HCC) 09/24/2018  . Neurocognitive deficits 09/19/2018  . 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 Swelling of left parotid gland 09/19/2018  . 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 Discoid lupus erythematosus 04/30/2018  . Enthesopathy 01/04/2017  . Biceps rupture, distal 02/25/2016  . Erectile dysfunction, uses prn Revatio 12/24/2011  . History of tear  of ACL (anterior cruciate ligament) 12/24/2011    Social History   Tobacco Use  . Smoking status: Never Smoker  . Smokeless tobacco: Never Used  Substance Use Topics  . Alcohol use: No  . Drug use: No    Current Medications and Allergies:    Current Outpatient Medications:  .  ciprofloxacin (CIPRO) 750 MG tablet, Take 1 tablet by mouth twice daily, Disp: 60 tablet, Rfl: 0 .  clindamycin (CLEOCIN) 150 MG capsule, TAKE 3 CAPSULES BY MOUTH EVERY 8 HOURS, Disp: 270 capsule, Rfl: 0 .  diclofenac sodium (VOLTAREN) 1 % GEL, Apply 2 g topically 4 (four) times daily., Disp: 1 Tube, Rfl: 2 .  ibuprofen (ADVIL,MOTRIN) 600 MG tablet, Take 1 tablet (600 mg total) by mouth every 6 (six) hours as needed for moderate pain., Disp: 30 tablet, Rfl: 0 .  sildenafil  (REVATIO) 20 MG tablet, TAKE 1 TABLET BY MOUTH ONCE DAILY AS NEEDED AS DIRECTED FOR ERECTILE DYSFUNCTION, Disp: , Rfl:   No Known Allergies  Review of Systems   ROS  Vitals:   Vitals:   11/20/18 0900  BP: (!) 160/100  Pulse: 84  Temp: 98.3 F (36.8 C)  TempSrc: Oral  SpO2: 99%  Weight: 174 lb (78.9 kg)  Height: 5\' 8"  (1.727 m)     Body mass index is 26.46 kg/m.   Physical Exam:    Physical Exam   Assessment and Plan:    There are no diagnoses linked to this encounter.  . Reviewed expectations re: course of current medical issues. . Discussed self-management of symptoms. . Outlined signs and symptoms indicating need for more acute intervention. . Patient verbalized understanding and all questions were answered. . See orders for this visit as documented in the electronic medical record. . Patient received an After Visit Summary.  ***  Jarold MottoSamantha Worley, PA-C [place], [facility] 11/20/2018  Follow-up: No follow-ups on file.

## 2018-11-20 NOTE — Progress Notes (Deleted)
Darren Watson is a 53 y.o. male is here for follow up.  History of Present Illness:   (SCRIBE ATTESTATION)  HPI:   Health Maintenance Due  Topic Date Due  . HIV Screening  07/26/1979  . COLONOSCOPY  07/25/2014  . TETANUS/TDAP  07/03/2016   No flowsheet data found. PMHx, SurgHx, SocialHx, FamHx, Medications, and Allergies were reviewed in the Visit Navigator and updated as appropriate.   Patient Active Problem List   Diagnosis Date Noted  . History of orbital floor (blow-out) closed fracture (HCC) 09/24/2018  . Neurocognitive deficits 09/19/2018  . 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 Swelling of left parotid gland 09/19/2018  . 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 Discoid lupus erythematosus 04/30/2018  . Enthesopathy 01/04/2017  . Biceps rupture, distal 02/25/2016  . Erectile dysfunction, uses prn Revatio 12/24/2011  . History of tear of ACL (anterior cruciate ligament) 12/24/2011   Social History   Tobacco Use  . Smoking status: Never Smoker  . Smokeless tobacco: Never Used  Substance Use Topics  . Alcohol use: No  . Drug use: No   Current Medications and Allergies   Current Outpatient Medications:  .  ciprofloxacin (CIPRO) 750 MG tablet, Take 1 tablet by mouth twice daily, Disp: 60 tablet, Rfl: 0 .  clindamycin (CLEOCIN) 150 MG capsule, TAKE 3 CAPSULES BY MOUTH EVERY 8 HOURS, Disp: 270 capsule, Rfl: 0 .  diclofenac sodium (VOLTAREN) 1 % GEL, Apply 2 g topically 4 (four) times daily., Disp: 1 Tube, Rfl: 2 .  ibuprofen (ADVIL,MOTRIN) 600 MG tablet, Take 1 tablet (600 mg total) by mouth every 6 (six) hours as needed for moderate pain., Disp: 30 tablet, Rfl: 0 .  sildenafil (REVATIO) 20 MG tablet, TAKE 1 TABLET BY MOUTH ONCE DAILY AS NEEDED AS DIRECTED FOR ERECTILE DYSFUNCTION, Disp: , Rfl:   No Known Allergies Review of Systems   Pertinent items are noted in the HPI. Otherwise, a complete ROS is negative.  Vitals  There were no vitals  filed for this visit.   There is no height or weight on file to calculate BMI.  Physical Exam   Physical Exam  Results for orders placed or performed during the hospital encounter of 10/23/17  CBC with Differential/Platelet  Result Value Ref Range   WBC 7.6 4.0 - 10.5 K/uL   RBC 4.24 4.22 - 5.81 MIL/uL   Hemoglobin 14.1 13.0 - 17.0 g/dL   HCT 59.1 63.8 - 46.6 %   MCV 97.6 78.0 - 100.0 fL   MCH 33.3 26.0 - 34.0 pg   MCHC 34.1 30.0 - 36.0 g/dL   RDW 59.9 35.7 - 01.7 %   Platelets 247 150 - 400 K/uL   Neutrophils Relative % 65 %   Neutro Abs 5.0 1.7 - 7.7 K/uL   Lymphocytes Relative 25 %   Lymphs Abs 1.9 0.7 - 4.0 K/uL   Monocytes Relative 9 %   Monocytes Absolute 0.7 0.1 - 1.0 K/uL   Eosinophils Relative 1 %   Eosinophils Absolute 0.0 0.0 - 0.7 K/uL   Basophils Relative 0 %   Basophils Absolute 0.0 0.0 - 0.1 K/uL  Comprehensive metabolic panel  Result Value Ref Range   Sodium 135 135 - 145 mmol/L   Potassium 4.9 3.5 - 5.1 mmol/L   Chloride 97 (L) 101 - 111 mmol/L   CO2 27 22 - 32 mmol/L   Glucose, Bld 100 (H) 65 - 99 mg/dL   BUN 13 6 - 20 mg/dL  Creatinine, Ser 1.14 0.61 - 1.24 mg/dL   Calcium 9.5 8.9 - 10.210.3 mg/dL   Total Protein 7.4 6.5 - 8.1 g/dL   Albumin 3.5 3.5 - 5.0 g/dL   AST 32 15 - 41 U/L   ALT 13 (L) 17 - 63 U/L   Alkaline Phosphatase 42 38 - 126 U/L   Total Bilirubin 0.5 0.3 - 1.2 mg/dL   GFR calc non Af Amer >60 >60 mL/min   GFR calc Af Amer >60 >60 mL/min   Anion gap 11 5 - 15    Assessment and Plan   There are no diagnoses linked to this encounter.  . Orders and follow up as documented in [facility], reviewed diet, exercise and weight control, cardiovascular risk and specific lipid/LDL goals reviewed, reviewed medications and side effects in detail.  . Reviewed expectations re: course of current medical issues. . Outlined signs and symptoms indicating need for more acute intervention. . Patient verbalized understanding and all questions were  answered. . Patient received an After Visit Summary.  *** CMA served as Neurosurgeonscribe during this visit. History, Physical, and Plan performed by medical provider. The above documentation has been reviewed and is accurate and complete. Helane RimaErica Dalya Maselli, D.O.  Helane RimaErica Tiah Heckel, DO [place] 11/20/2018

## 2018-11-21 ENCOUNTER — Other Ambulatory Visit: Payer: Self-pay

## 2018-11-21 DIAGNOSIS — R29818 Other symptoms and signs involving the nervous system: Secondary | ICD-10-CM

## 2018-11-21 DIAGNOSIS — F0781 Postconcussional syndrome: Secondary | ICD-10-CM

## 2018-11-28 ENCOUNTER — Telehealth: Payer: Self-pay | Admitting: Family Medicine

## 2018-11-28 DIAGNOSIS — S060X1S Concussion with loss of consciousness of 30 minutes or less, sequela: Secondary | ICD-10-CM | POA: Diagnosis not present

## 2018-11-28 DIAGNOSIS — Z792 Long term (current) use of antibiotics: Secondary | ICD-10-CM | POA: Diagnosis not present

## 2018-11-28 DIAGNOSIS — S0232XS Fracture of orbital floor, left side, sequela: Secondary | ICD-10-CM | POA: Diagnosis not present

## 2018-11-28 DIAGNOSIS — M779 Enthesopathy, unspecified: Secondary | ICD-10-CM | POA: Diagnosis not present

## 2018-11-28 DIAGNOSIS — M47812 Spondylosis without myelopathy or radiculopathy, cervical region: Secondary | ICD-10-CM | POA: Diagnosis not present

## 2018-11-28 DIAGNOSIS — R030 Elevated blood-pressure reading, without diagnosis of hypertension: Secondary | ICD-10-CM | POA: Diagnosis not present

## 2018-11-28 DIAGNOSIS — L930 Discoid lupus erythematosus: Secondary | ICD-10-CM | POA: Diagnosis not present

## 2018-11-28 DIAGNOSIS — Z87828 Personal history of other (healed) physical injury and trauma: Secondary | ICD-10-CM | POA: Diagnosis not present

## 2018-11-28 DIAGNOSIS — K1123 Chronic sialoadenitis: Secondary | ICD-10-CM | POA: Diagnosis not present

## 2018-11-28 NOTE — Telephone Encounter (Signed)
Called and spoke with Penni Bombard, he is wanting to get pt set up for speech therapy, they have a program that they do for cognitive impairment that he thinks will be beneficial for patient.   Verbal order provided.

## 2018-11-28 NOTE — Telephone Encounter (Signed)
See note

## 2018-11-28 NOTE — Telephone Encounter (Signed)
Copied from CRM [id number]. Topic: Quick Communication - Home Health Verbal Orders >> Nov 28, 2018  2:45 PM Jaquita Rector A wrote: Caller/Agency: [facility] Callback Number: [phone] ok to LM Requesting OT/PT/Skilled Nursing/Social Work/Speech Therapy: Speech Therapy Evaluation Frequency:   Need to be evaluated for cognitive issues

## 2018-12-02 ENCOUNTER — Telehealth: Payer: Self-pay | Admitting: Family Medicine

## 2018-12-02 NOTE — Telephone Encounter (Signed)
Okay 

## 2018-12-02 NOTE — Telephone Encounter (Signed)
See note

## 2018-12-02 NOTE — Telephone Encounter (Signed)
Dr. Earlene Plater, OK to delay ST until week of June 15th?

## 2018-12-02 NOTE — Telephone Encounter (Signed)
Called [phone], advised that this was the wrong number.

## 2018-12-02 NOTE — Telephone Encounter (Unsigned)
Copied from CRM [id number]. Topic: Quick Communication - Home Health Verbal Orders >> Dec 02, 2018 12:09 PM Deborha Payment wrote: Caller/Agency: Deirdre from [facility]  Callback Number: [phone] Requesting delayed ST due to Deirdre being exposed to COVID through another one of her patients.  Deirdre wants the delay until week of June 15th Frequency: n/a

## 2018-12-03 NOTE — Telephone Encounter (Signed)
Called back was given message in chart Ph# [phone]

## 2018-12-04 DIAGNOSIS — Z792 Long term (current) use of antibiotics: Secondary | ICD-10-CM | POA: Diagnosis not present

## 2018-12-04 DIAGNOSIS — Z87828 Personal history of other (healed) physical injury and trauma: Secondary | ICD-10-CM | POA: Diagnosis not present

## 2018-12-04 DIAGNOSIS — R030 Elevated blood-pressure reading, without diagnosis of hypertension: Secondary | ICD-10-CM | POA: Diagnosis not present

## 2018-12-04 DIAGNOSIS — S0232XS Fracture of orbital floor, left side, sequela: Secondary | ICD-10-CM | POA: Diagnosis not present

## 2018-12-04 DIAGNOSIS — S060X1S Concussion with loss of consciousness of 30 minutes or less, sequela: Secondary | ICD-10-CM | POA: Diagnosis not present

## 2018-12-04 DIAGNOSIS — M779 Enthesopathy, unspecified: Secondary | ICD-10-CM | POA: Diagnosis not present

## 2018-12-04 DIAGNOSIS — M47812 Spondylosis without myelopathy or radiculopathy, cervical region: Secondary | ICD-10-CM | POA: Diagnosis not present

## 2018-12-04 DIAGNOSIS — L930 Discoid lupus erythematosus: Secondary | ICD-10-CM | POA: Diagnosis not present

## 2018-12-04 DIAGNOSIS — K1123 Chronic sialoadenitis: Secondary | ICD-10-CM | POA: Diagnosis not present

## 2018-12-06 ENCOUNTER — Ambulatory Visit: Payer: Medicare Other | Admitting: Neurology

## 2018-12-06 DIAGNOSIS — L930 Discoid lupus erythematosus: Secondary | ICD-10-CM | POA: Diagnosis not present

## 2018-12-06 DIAGNOSIS — S0232XS Fracture of orbital floor, left side, sequela: Secondary | ICD-10-CM | POA: Diagnosis not present

## 2018-12-06 DIAGNOSIS — K1123 Chronic sialoadenitis: Secondary | ICD-10-CM | POA: Diagnosis not present

## 2018-12-06 DIAGNOSIS — M47812 Spondylosis without myelopathy or radiculopathy, cervical region: Secondary | ICD-10-CM | POA: Diagnosis not present

## 2018-12-06 DIAGNOSIS — M779 Enthesopathy, unspecified: Secondary | ICD-10-CM | POA: Diagnosis not present

## 2018-12-06 DIAGNOSIS — Z87828 Personal history of other (healed) physical injury and trauma: Secondary | ICD-10-CM | POA: Diagnosis not present

## 2018-12-06 DIAGNOSIS — S060X1S Concussion with loss of consciousness of 30 minutes or less, sequela: Secondary | ICD-10-CM | POA: Diagnosis not present

## 2018-12-06 DIAGNOSIS — Z792 Long term (current) use of antibiotics: Secondary | ICD-10-CM | POA: Diagnosis not present

## 2018-12-06 DIAGNOSIS — R030 Elevated blood-pressure reading, without diagnosis of hypertension: Secondary | ICD-10-CM | POA: Diagnosis not present

## 2018-12-11 ENCOUNTER — Telehealth: Payer: Self-pay | Admitting: Family Medicine

## 2018-12-11 DIAGNOSIS — L930 Discoid lupus erythematosus: Secondary | ICD-10-CM | POA: Diagnosis not present

## 2018-12-11 DIAGNOSIS — M779 Enthesopathy, unspecified: Secondary | ICD-10-CM | POA: Diagnosis not present

## 2018-12-11 DIAGNOSIS — S060X1S Concussion with loss of consciousness of 30 minutes or less, sequela: Secondary | ICD-10-CM | POA: Diagnosis not present

## 2018-12-11 DIAGNOSIS — R030 Elevated blood-pressure reading, without diagnosis of hypertension: Secondary | ICD-10-CM | POA: Diagnosis not present

## 2018-12-11 DIAGNOSIS — K1123 Chronic sialoadenitis: Secondary | ICD-10-CM | POA: Diagnosis not present

## 2018-12-11 DIAGNOSIS — S0232XS Fracture of orbital floor, left side, sequela: Secondary | ICD-10-CM | POA: Diagnosis not present

## 2018-12-11 DIAGNOSIS — M47812 Spondylosis without myelopathy or radiculopathy, cervical region: Secondary | ICD-10-CM | POA: Diagnosis not present

## 2018-12-11 DIAGNOSIS — Z87828 Personal history of other (healed) physical injury and trauma: Secondary | ICD-10-CM | POA: Diagnosis not present

## 2018-12-11 DIAGNOSIS — Z792 Long term (current) use of antibiotics: Secondary | ICD-10-CM | POA: Diagnosis not present

## 2018-12-11 NOTE — Telephone Encounter (Signed)
See note  Copied from [place] [id number]. Topic: General - Inquiry >> Dec 10, 2018  4:37 PM Lionel December wrote: Reason for CRM: Elmyra Ricks from [facility] called needing a verbal order for patient to move social work eval until the beginning of next week.

## 2018-12-11 NOTE — Telephone Encounter (Signed)
Called and gave verbal to [place]

## 2018-12-13 DIAGNOSIS — L930 Discoid lupus erythematosus: Secondary | ICD-10-CM | POA: Diagnosis not present

## 2018-12-13 DIAGNOSIS — M779 Enthesopathy, unspecified: Secondary | ICD-10-CM | POA: Diagnosis not present

## 2018-12-13 DIAGNOSIS — Z87828 Personal history of other (healed) physical injury and trauma: Secondary | ICD-10-CM | POA: Diagnosis not present

## 2018-12-13 DIAGNOSIS — K1123 Chronic sialoadenitis: Secondary | ICD-10-CM | POA: Diagnosis not present

## 2018-12-13 DIAGNOSIS — R030 Elevated blood-pressure reading, without diagnosis of hypertension: Secondary | ICD-10-CM | POA: Diagnosis not present

## 2018-12-13 DIAGNOSIS — M47812 Spondylosis without myelopathy or radiculopathy, cervical region: Secondary | ICD-10-CM | POA: Diagnosis not present

## 2018-12-13 DIAGNOSIS — S060X1S Concussion with loss of consciousness of 30 minutes or less, sequela: Secondary | ICD-10-CM | POA: Diagnosis not present

## 2018-12-13 DIAGNOSIS — S0232XS Fracture of orbital floor, left side, sequela: Secondary | ICD-10-CM | POA: Diagnosis not present

## 2018-12-13 DIAGNOSIS — Z792 Long term (current) use of antibiotics: Secondary | ICD-10-CM | POA: Diagnosis not present

## 2018-12-16 DIAGNOSIS — K1123 Chronic sialoadenitis: Secondary | ICD-10-CM | POA: Diagnosis not present

## 2018-12-16 DIAGNOSIS — M47812 Spondylosis without myelopathy or radiculopathy, cervical region: Secondary | ICD-10-CM | POA: Diagnosis not present

## 2018-12-16 DIAGNOSIS — S060X1S Concussion with loss of consciousness of 30 minutes or less, sequela: Secondary | ICD-10-CM | POA: Diagnosis not present

## 2018-12-16 DIAGNOSIS — L930 Discoid lupus erythematosus: Secondary | ICD-10-CM | POA: Diagnosis not present

## 2018-12-16 DIAGNOSIS — R030 Elevated blood-pressure reading, without diagnosis of hypertension: Secondary | ICD-10-CM | POA: Diagnosis not present

## 2018-12-16 DIAGNOSIS — Z87828 Personal history of other (healed) physical injury and trauma: Secondary | ICD-10-CM | POA: Diagnosis not present

## 2018-12-16 DIAGNOSIS — Z792 Long term (current) use of antibiotics: Secondary | ICD-10-CM | POA: Diagnosis not present

## 2018-12-16 DIAGNOSIS — S0232XS Fracture of orbital floor, left side, sequela: Secondary | ICD-10-CM | POA: Diagnosis not present

## 2018-12-16 DIAGNOSIS — M779 Enthesopathy, unspecified: Secondary | ICD-10-CM | POA: Diagnosis not present

## 2018-12-17 DIAGNOSIS — M47812 Spondylosis without myelopathy or radiculopathy, cervical region: Secondary | ICD-10-CM | POA: Diagnosis not present

## 2018-12-17 DIAGNOSIS — L930 Discoid lupus erythematosus: Secondary | ICD-10-CM | POA: Diagnosis not present

## 2018-12-17 DIAGNOSIS — Z87828 Personal history of other (healed) physical injury and trauma: Secondary | ICD-10-CM | POA: Diagnosis not present

## 2018-12-17 DIAGNOSIS — S0232XS Fracture of orbital floor, left side, sequela: Secondary | ICD-10-CM | POA: Diagnosis not present

## 2018-12-17 DIAGNOSIS — Z792 Long term (current) use of antibiotics: Secondary | ICD-10-CM | POA: Diagnosis not present

## 2018-12-17 DIAGNOSIS — K1123 Chronic sialoadenitis: Secondary | ICD-10-CM | POA: Diagnosis not present

## 2018-12-17 DIAGNOSIS — S060X1S Concussion with loss of consciousness of 30 minutes or less, sequela: Secondary | ICD-10-CM | POA: Diagnosis not present

## 2018-12-17 DIAGNOSIS — R030 Elevated blood-pressure reading, without diagnosis of hypertension: Secondary | ICD-10-CM | POA: Diagnosis not present

## 2018-12-17 DIAGNOSIS — M779 Enthesopathy, unspecified: Secondary | ICD-10-CM | POA: Diagnosis not present

## 2018-12-18 ENCOUNTER — Telehealth: Payer: Self-pay | Admitting: Family Medicine

## 2018-12-18 DIAGNOSIS — S0232XS Fracture of orbital floor, left side, sequela: Secondary | ICD-10-CM | POA: Diagnosis not present

## 2018-12-18 DIAGNOSIS — Z792 Long term (current) use of antibiotics: Secondary | ICD-10-CM | POA: Diagnosis not present

## 2018-12-18 DIAGNOSIS — Z87828 Personal history of other (healed) physical injury and trauma: Secondary | ICD-10-CM | POA: Diagnosis not present

## 2018-12-18 DIAGNOSIS — L930 Discoid lupus erythematosus: Secondary | ICD-10-CM | POA: Diagnosis not present

## 2018-12-18 DIAGNOSIS — M779 Enthesopathy, unspecified: Secondary | ICD-10-CM | POA: Diagnosis not present

## 2018-12-18 DIAGNOSIS — S060X1S Concussion with loss of consciousness of 30 minutes or less, sequela: Secondary | ICD-10-CM | POA: Diagnosis not present

## 2018-12-18 DIAGNOSIS — R030 Elevated blood-pressure reading, without diagnosis of hypertension: Secondary | ICD-10-CM | POA: Diagnosis not present

## 2018-12-18 DIAGNOSIS — M47812 Spondylosis without myelopathy or radiculopathy, cervical region: Secondary | ICD-10-CM | POA: Diagnosis not present

## 2018-12-18 DIAGNOSIS — K1123 Chronic sialoadenitis: Secondary | ICD-10-CM | POA: Diagnosis not present

## 2018-12-18 NOTE — Telephone Encounter (Signed)
Yes

## 2018-12-18 NOTE — Telephone Encounter (Signed)
°  Deirdra with [facility] call to req verbal for speech therapy   1 x 2     2 x 2     1 x 1     [phone]

## 2018-12-18 NOTE — Telephone Encounter (Signed)
Okay to give verbal orders?  ?

## 2018-12-19 ENCOUNTER — Telehealth: Payer: Self-pay | Admitting: Family Medicine

## 2018-12-19 DIAGNOSIS — M47812 Spondylosis without myelopathy or radiculopathy, cervical region: Secondary | ICD-10-CM | POA: Diagnosis not present

## 2018-12-19 DIAGNOSIS — R030 Elevated blood-pressure reading, without diagnosis of hypertension: Secondary | ICD-10-CM | POA: Diagnosis not present

## 2018-12-19 DIAGNOSIS — Z792 Long term (current) use of antibiotics: Secondary | ICD-10-CM | POA: Diagnosis not present

## 2018-12-19 DIAGNOSIS — S060X1S Concussion with loss of consciousness of 30 minutes or less, sequela: Secondary | ICD-10-CM | POA: Diagnosis not present

## 2018-12-19 DIAGNOSIS — Z87828 Personal history of other (healed) physical injury and trauma: Secondary | ICD-10-CM | POA: Diagnosis not present

## 2018-12-19 DIAGNOSIS — M779 Enthesopathy, unspecified: Secondary | ICD-10-CM | POA: Diagnosis not present

## 2018-12-19 DIAGNOSIS — S0232XS Fracture of orbital floor, left side, sequela: Secondary | ICD-10-CM | POA: Diagnosis not present

## 2018-12-19 DIAGNOSIS — L930 Discoid lupus erythematosus: Secondary | ICD-10-CM | POA: Diagnosis not present

## 2018-12-19 DIAGNOSIS — K1123 Chronic sialoadenitis: Secondary | ICD-10-CM | POA: Diagnosis not present

## 2018-12-19 NOTE — Telephone Encounter (Signed)
Verbal orders given to [facility]

## 2018-12-19 NOTE — Telephone Encounter (Signed)
Joelene Millin from [facility] called to inform the doctor that the patient has developed a rash on his chest and arm.  She indicated that he has had it for a couple of days.  Please advise and CB# [phone]

## 2018-12-19 NOTE — Telephone Encounter (Signed)
Do you need to see him? Should he be treated there?

## 2018-12-20 NOTE — Telephone Encounter (Signed)
Please call for app  

## 2018-12-20 NOTE — Telephone Encounter (Signed)
This can be a virtual visit. I have no available slots but can be scheduled with another provider.

## 2018-12-23 NOTE — Telephone Encounter (Signed)
Have called all numbers including emergency contact and l/m to call office. IF patient is loosing consciousness he needs to be directed to call 911 for immediate  evaluation.

## 2018-12-23 NOTE — Telephone Encounter (Signed)
Called pt and schedule in office for 7/1. Pt stated hes having reactions to meds hes on. And he is going in and out of consciousness.  Pt refused virtual visit. Please advise if pt needs sooner.

## 2018-12-24 ENCOUNTER — Telehealth: Payer: Self-pay | Admitting: Family Medicine

## 2018-12-24 NOTE — Telephone Encounter (Signed)
See note

## 2018-12-24 NOTE — Telephone Encounter (Signed)
Patient did not come to door or answer phone but she did confirm appointment this morning. But did not answer when she called when at home. Gave ok to omit today's visit.

## 2018-12-24 NOTE — Telephone Encounter (Signed)
Home Health Verbal Orders - Caller/Agency: [facility] Number: [phone] Requesting OT/PT/Skilled Nursing/Social Work/Speech Therapy: speech Frequency: pt said he could do today's appt, but when [facility] got there, pt did not answer door or phone.  Dierdre is asking to omit todays visit. She would like call back today.  Please leave voicemail if no answer.

## 2018-12-24 NOTE — Telephone Encounter (Signed)
Called and l/m on all numbers for second time.

## 2018-12-26 DIAGNOSIS — Z792 Long term (current) use of antibiotics: Secondary | ICD-10-CM | POA: Diagnosis not present

## 2018-12-26 DIAGNOSIS — L930 Discoid lupus erythematosus: Secondary | ICD-10-CM | POA: Diagnosis not present

## 2018-12-26 DIAGNOSIS — S0232XS Fracture of orbital floor, left side, sequela: Secondary | ICD-10-CM | POA: Diagnosis not present

## 2018-12-26 DIAGNOSIS — R030 Elevated blood-pressure reading, without diagnosis of hypertension: Secondary | ICD-10-CM | POA: Diagnosis not present

## 2018-12-26 DIAGNOSIS — K1123 Chronic sialoadenitis: Secondary | ICD-10-CM | POA: Diagnosis not present

## 2018-12-26 DIAGNOSIS — M47812 Spondylosis without myelopathy or radiculopathy, cervical region: Secondary | ICD-10-CM | POA: Diagnosis not present

## 2018-12-26 DIAGNOSIS — S060X1S Concussion with loss of consciousness of 30 minutes or less, sequela: Secondary | ICD-10-CM | POA: Diagnosis not present

## 2018-12-26 DIAGNOSIS — Z87828 Personal history of other (healed) physical injury and trauma: Secondary | ICD-10-CM | POA: Diagnosis not present

## 2018-12-26 DIAGNOSIS — M779 Enthesopathy, unspecified: Secondary | ICD-10-CM | POA: Diagnosis not present

## 2018-12-27 NOTE — Telephone Encounter (Signed)
3rd time calling all numbers not able to reach

## 2018-12-31 ENCOUNTER — Telehealth: Payer: Self-pay | Admitting: Physical Therapy

## 2018-12-31 DIAGNOSIS — Z792 Long term (current) use of antibiotics: Secondary | ICD-10-CM | POA: Diagnosis not present

## 2018-12-31 DIAGNOSIS — L930 Discoid lupus erythematosus: Secondary | ICD-10-CM | POA: Diagnosis not present

## 2018-12-31 DIAGNOSIS — M779 Enthesopathy, unspecified: Secondary | ICD-10-CM | POA: Diagnosis not present

## 2018-12-31 DIAGNOSIS — S060X1S Concussion with loss of consciousness of 30 minutes or less, sequela: Secondary | ICD-10-CM | POA: Diagnosis not present

## 2018-12-31 DIAGNOSIS — K1123 Chronic sialoadenitis: Secondary | ICD-10-CM | POA: Diagnosis not present

## 2018-12-31 DIAGNOSIS — Z87828 Personal history of other (healed) physical injury and trauma: Secondary | ICD-10-CM | POA: Diagnosis not present

## 2018-12-31 DIAGNOSIS — S0232XS Fracture of orbital floor, left side, sequela: Secondary | ICD-10-CM | POA: Diagnosis not present

## 2018-12-31 DIAGNOSIS — M47812 Spondylosis without myelopathy or radiculopathy, cervical region: Secondary | ICD-10-CM | POA: Diagnosis not present

## 2018-12-31 DIAGNOSIS — R030 Elevated blood-pressure reading, without diagnosis of hypertension: Secondary | ICD-10-CM | POA: Diagnosis not present

## 2018-12-31 NOTE — Telephone Encounter (Signed)
Copied from [place] [id number]. Topic: Referral - Request for Referral >> Dec 31, 2018  3:56 PM Erick Blinks wrote: Has patient seen PCP for this complaint? Yes.   *If NO, is insurance requiring patient see PCP for this issue before PCP can refer them? Referral for which specialty: Neurology  Preferred provider/office: [facility] Reason for referral: Some cognitive changes that pt is seeking a professional opinion. Recent infection on his face that has caused neurological affects.   Agustina Caroli: [phone] VM okay

## 2018-12-31 NOTE — Telephone Encounter (Signed)
Okay referral but she may refuse. Other Neurologist refused.

## 2018-12-31 NOTE — Telephone Encounter (Signed)
Will this office see patient?

## 2019-01-01 ENCOUNTER — Ambulatory Visit: Payer: Medicare Other | Admitting: Family Medicine

## 2019-01-01 ENCOUNTER — Ambulatory Visit: Payer: Medicare Other | Admitting: Physician Assistant

## 2019-01-01 ENCOUNTER — Telehealth: Payer: Self-pay

## 2019-01-01 ENCOUNTER — Other Ambulatory Visit: Payer: Self-pay

## 2019-01-01 DIAGNOSIS — L930 Discoid lupus erythematosus: Secondary | ICD-10-CM | POA: Diagnosis not present

## 2019-01-01 DIAGNOSIS — R030 Elevated blood-pressure reading, without diagnosis of hypertension: Secondary | ICD-10-CM | POA: Diagnosis not present

## 2019-01-01 DIAGNOSIS — S060X1S Concussion with loss of consciousness of 30 minutes or less, sequela: Secondary | ICD-10-CM | POA: Diagnosis not present

## 2019-01-01 DIAGNOSIS — K1123 Chronic sialoadenitis: Secondary | ICD-10-CM | POA: Diagnosis not present

## 2019-01-01 DIAGNOSIS — M47812 Spondylosis without myelopathy or radiculopathy, cervical region: Secondary | ICD-10-CM | POA: Diagnosis not present

## 2019-01-01 DIAGNOSIS — Z87828 Personal history of other (healed) physical injury and trauma: Secondary | ICD-10-CM | POA: Diagnosis not present

## 2019-01-01 DIAGNOSIS — Z792 Long term (current) use of antibiotics: Secondary | ICD-10-CM | POA: Diagnosis not present

## 2019-01-01 DIAGNOSIS — M779 Enthesopathy, unspecified: Secondary | ICD-10-CM | POA: Diagnosis not present

## 2019-01-01 DIAGNOSIS — S0232XS Fracture of orbital floor, left side, sequela: Secondary | ICD-10-CM | POA: Diagnosis not present

## 2019-01-01 DIAGNOSIS — R29818 Other symptoms and signs involving the nervous system: Secondary | ICD-10-CM

## 2019-01-01 DIAGNOSIS — F0781 Postconcussional syndrome: Secondary | ICD-10-CM

## 2019-01-01 NOTE — Telephone Encounter (Signed)
Patient app was scheduled wrong called and offered to move to Sam schedule. Patient did not want to see sam. multiple times during conversation that patient did not want to bee seen today. He states that his issue is critical but he does not want to be evaluated by any other provider. The first appointment with Dr. Juleen China is in three weeks. He was ok with that. We have started referral to new neurologist.

## 2019-01-01 NOTE — Progress Notes (Deleted)
Darren Watson is a 53 y.o. male is here for follow up.  History of Present Illness:   (SCRIBE ATTESTATION)  HPI:   Health Maintenance Due  Topic Date Due  . HIV Screening  07/26/1979  . COLONOSCOPY  07/25/2014  . TETANUS/TDAP  07/03/2016   No flowsheet data found. PMHx, SurgHx, SocialHx, FamHx, Medications, and Allergies were reviewed in the Visit Navigator and updated as appropriate.   Patient Active Problem List   Diagnosis Date Noted  . History of orbital floor (blow-out) closed fracture (HCC) 09/24/2018  . Neurocognitive deficits 09/19/2018  . 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 Swelling of left parotid gland 09/19/2018  . 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 Discoid lupus erythematosus 04/30/2018  . Enthesopathy 01/04/2017  . Biceps rupture, distal 02/25/2016  . Erectile dysfunction, uses prn Revatio 12/24/2011  . History of tear of ACL (anterior cruciate ligament) 12/24/2011   Social History   Tobacco Use  . Smoking status: Never Smoker  . Smokeless tobacco: Never Used  Substance Use Topics  . Alcohol use: No  . Drug use: No   Current Medications and Allergies   Current Outpatient Medications:  .  ciprofloxacin (CIPRO) 750 MG tablet, Take 1 tablet (750 mg total) by mouth 2 (two) times daily., Disp: 60 tablet, Rfl: 0 .  clindamycin (CLEOCIN) 150 MG capsule, TAKE 3 CAPSULES BY MOUTH EVERY 8 HOURS, Disp: 270 capsule, Rfl: 0 .  diclofenac sodium (VOLTAREN) 1 % GEL, Apply 2 g topically 4 (four) times daily., Disp: 1 Tube, Rfl: 2 .  ibuprofen (ADVIL,MOTRIN) 600 MG tablet, Take 1 tablet (600 mg total) by mouth every 6 (six) hours as needed for moderate pain., Disp: 30 tablet, Rfl: 0 .  sildenafil (REVATIO) 20 MG tablet, TAKE 1 TABLET BY MOUTH ONCE DAILY AS NEEDED AS DIRECTED FOR ERECTILE DYSFUNCTION, Disp: , Rfl:   No Known Allergies Review of Systems   Pertinent items are noted in the HPI. Otherwise, a complete ROS is negative.  Vitals    There were no vitals filed for this visit.   There is no height or weight on file to calculate BMI.  Physical Exam   Physical Exam  Results for orders placed or performed during the hospital encounter of 10/23/17  CBC with Differential/Platelet  Result Value Ref Range   WBC 7.6 4.0 - 10.5 K/uL   RBC 4.24 4.22 - 5.81 MIL/uL   Hemoglobin 14.1 13.0 - 17.0 g/dL   HCT 16.141.4 09.639.0 - 04.552.0 %   MCV 97.6 78.0 - 100.0 fL   MCH 33.3 26.0 - 34.0 pg   MCHC 34.1 30.0 - 36.0 g/dL   RDW 40.913.0 81.111.5 - 91.415.5 %   Platelets 247 150 - 400 K/uL   Neutrophils Relative % 65 %   Neutro Abs 5.0 1.7 - 7.7 K/uL   Lymphocytes Relative 25 %   Lymphs Abs 1.9 0.7 - 4.0 K/uL   Monocytes Relative 9 %   Monocytes Absolute 0.7 0.1 - 1.0 K/uL   Eosinophils Relative 1 %   Eosinophils Absolute 0.0 0.0 - 0.7 K/uL   Basophils Relative 0 %   Basophils Absolute 0.0 0.0 - 0.1 K/uL  Comprehensive metabolic panel  Result Value Ref Range   Sodium 135 135 - 145 mmol/L   Potassium 4.9 3.5 - 5.1 mmol/L   Chloride 97 (L) 101 - 111 mmol/L   CO2 27 22 - 32 mmol/L   Glucose, Bld 100 (H) 65 - 99 mg/dL  BUN 13 6 - 20 mg/dL   Creatinine, Ser 1.14 0.61 - 1.24 mg/dL   Calcium 9.5 8.9 - 10.3 mg/dL   Total Protein 7.4 6.5 - 8.1 g/dL   Albumin 3.5 3.5 - 5.0 g/dL   AST 32 15 - 41 U/L   ALT 13 (L) 17 - 63 U/L   Alkaline Phosphatase 42 38 - 126 U/L   Total Bilirubin 0.5 0.3 - 1.2 mg/dL   GFR calc non Af Amer >60 >60 mL/min   GFR calc Af Amer >60 >60 mL/min   Anion gap 11 5 - 15    Assessment and Plan   There are no diagnoses linked to this encounter.  . Orders and follow up as documented in [place], reviewed diet, exercise and weight control, cardiovascular risk and specific lipid/LDL goals reviewed, reviewed medications and side effects in detail.  . Reviewed expectations re: course of current medical issues. . Outlined signs and symptoms indicating need for more acute intervention. . Patient verbalized understanding and all  questions were answered. . Patient received an After Visit Summary.  *** CMA served as Education administrator during this visit. History, Physical, and Plan performed by medical provider. The above documentation has been reviewed and is accurate and complete. Briscoe Deutscher, D.O.  Briscoe Deutscher, DO [place], [facility] 01/01/2019

## 2019-01-01 NOTE — Telephone Encounter (Signed)
Referral placed patient informed.

## 2019-01-07 DIAGNOSIS — Z792 Long term (current) use of antibiotics: Secondary | ICD-10-CM | POA: Diagnosis not present

## 2019-01-07 DIAGNOSIS — K1123 Chronic sialoadenitis: Secondary | ICD-10-CM | POA: Diagnosis not present

## 2019-01-07 DIAGNOSIS — M47812 Spondylosis without myelopathy or radiculopathy, cervical region: Secondary | ICD-10-CM | POA: Diagnosis not present

## 2019-01-07 DIAGNOSIS — Z87828 Personal history of other (healed) physical injury and trauma: Secondary | ICD-10-CM | POA: Diagnosis not present

## 2019-01-07 DIAGNOSIS — S060X1S Concussion with loss of consciousness of 30 minutes or less, sequela: Secondary | ICD-10-CM | POA: Diagnosis not present

## 2019-01-07 DIAGNOSIS — L930 Discoid lupus erythematosus: Secondary | ICD-10-CM | POA: Diagnosis not present

## 2019-01-07 DIAGNOSIS — S0232XS Fracture of orbital floor, left side, sequela: Secondary | ICD-10-CM | POA: Diagnosis not present

## 2019-01-07 DIAGNOSIS — R030 Elevated blood-pressure reading, without diagnosis of hypertension: Secondary | ICD-10-CM | POA: Diagnosis not present

## 2019-01-07 DIAGNOSIS — M779 Enthesopathy, unspecified: Secondary | ICD-10-CM | POA: Diagnosis not present

## 2019-01-08 ENCOUNTER — Telehealth: Payer: Self-pay | Admitting: Family Medicine

## 2019-01-08 NOTE — Telephone Encounter (Signed)
See previous notes, phone calls, etc. I'm worried about this patient. Obvious brain injury related dementia. Will not accept diagnosis. We are having a very difficult time getting him to stay with specialist (ENT and Neurology).   Now, with fever x 1 week. Should be seen in ED.   Please help me by contacting Adult Protective Services at this point.

## 2019-01-08 NOTE — Telephone Encounter (Signed)
See note

## 2019-01-08 NOTE — Telephone Encounter (Signed)
Copied from [place] [id number]. Topic: Quick Communication - Home Health Verbal Orders >> Jan 08, 2019  8:19 AM Jodie Echevaria wrote: Caller/Agency: Deidre / [place] Number: [phone] ok to LM Requesting OT/PT/Skilled Nursing/Social Work/Speech Therapy: Home Health  Frequency: Requesting to put patient care on hold for 2 weeks or until patient is seen by doctor. Per Deidre patient have had a fever for at least 1 week with temp 99.9 and diarrhea also patient was instructed to go see PCP but refuses. Deidre would also like to discharge patient from care at next visit. She also request for provider to follow up with patient if possible please.

## 2019-01-08 NOTE — Telephone Encounter (Signed)
Deidre with [facility] calling to check status of verbal order request. Please advise.

## 2019-01-08 NOTE — Telephone Encounter (Signed)
Please advise 

## 2019-01-09 NOTE — Telephone Encounter (Signed)
Called and l/m vm letting her know that we have tried to reach and get him to go to ED and office but patient refuses each time. If she has questions she can call back to office.   I will be calling adult protective services for patient as well today.

## 2019-01-09 NOTE — Telephone Encounter (Signed)
Called [place] adult services spoke to [facility]. Let her know situation she will return call when supervisor reviews report.

## 2019-01-20 ENCOUNTER — Ambulatory Visit: Payer: Medicare Other | Admitting: Family Medicine

## 2019-01-21 DIAGNOSIS — Z792 Long term (current) use of antibiotics: Secondary | ICD-10-CM | POA: Diagnosis not present

## 2019-01-21 DIAGNOSIS — S0232XS Fracture of orbital floor, left side, sequela: Secondary | ICD-10-CM | POA: Diagnosis not present

## 2019-01-21 DIAGNOSIS — Z87828 Personal history of other (healed) physical injury and trauma: Secondary | ICD-10-CM | POA: Diagnosis not present

## 2019-01-21 DIAGNOSIS — M47812 Spondylosis without myelopathy or radiculopathy, cervical region: Secondary | ICD-10-CM | POA: Diagnosis not present

## 2019-01-21 DIAGNOSIS — M779 Enthesopathy, unspecified: Secondary | ICD-10-CM | POA: Diagnosis not present

## 2019-01-21 DIAGNOSIS — S060X1S Concussion with loss of consciousness of 30 minutes or less, sequela: Secondary | ICD-10-CM | POA: Diagnosis not present

## 2019-01-21 DIAGNOSIS — K1123 Chronic sialoadenitis: Secondary | ICD-10-CM | POA: Diagnosis not present

## 2019-01-21 DIAGNOSIS — L930 Discoid lupus erythematosus: Secondary | ICD-10-CM | POA: Diagnosis not present

## 2019-01-21 DIAGNOSIS — R030 Elevated blood-pressure reading, without diagnosis of hypertension: Secondary | ICD-10-CM | POA: Diagnosis not present

## 2019-01-21 NOTE — Progress Notes (Signed)
Rod MaeWilliam H Wester is a 53 y.o. male is here for follow up.  History of Present Illness:   HPI: Patient with complicated history of parotid swelling, with last ENT evaluation March 2020. Dx: left posterior facial skin tenderness and overlying changes consistent with potential nodular fasciitis. 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 Patient did not want to return to previous Neurologist and others have declined to see him. During this time, he has been on chronic antibiotics. He stopped those antibiotics recently and developed a fever so restarted.   He is notably more focused today. Still very anxious about his facial changes but much more easily redirected today.  Health Maintenance Due  Topic Date Due  . HIV Screening  07/26/1979  . COLONOSCOPY  07/25/2014  . TETANUS/TDAP  07/03/2016   No flowsheet data found.   PMHx, SurgHx, SocialHx, FamHx, Medications, and Allergies were reviewed in the Visit Navigator and updated as appropriate.   Patient Active Problem List   Diagnosis Date Noted  . History of orbital floor (blow-out) closed fracture (HCC) 09/24/2018  . Neurocognitive deficits 09/19/2018  . 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 Swelling of left parotid gland 09/19/2018  . 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 Discoid lupus erythematosus 04/30/2018  . Enthesopathy 01/04/2017  . Biceps rupture, distal 02/25/2016  . Erectile dysfunction, uses prn Revatio 12/24/2011  . History of tear of ACL (anterior cruciate ligament) 12/24/2011   Social History   Tobacco Use  . Smoking status: Never Smoker  . Smokeless tobacco: Never Used  Substance Use Topics  . Alcohol use: No  . Drug use: No   Current Medications and Allergies   .  ciprofloxacin (CIPRO) 750 MG tablet, Take 1 tablet (750 mg total) by mouth 2 (two) times daily., Disp: 60 tablet, Rfl: 0 .  clindamycin (CLEOCIN) 150 MG capsule,  TAKE 3 CAPSULES BY MOUTH EVERY 8 HOURS, Disp: 270 capsule, Rfl: 0 .  diclofenac sodium (VOLTAREN) 1 % GEL, Apply 2 g topically 4 (four) times daily., Disp: 90 g, Rfl: 1 .  ibuprofen (ADVIL) 600 MG tablet, Take 1 tablet (600 mg total) by mouth every 6 (six) hours as needed for moderate pain., Disp: 30 tablet, Rfl: 0 .  sildenafil (REVATIO) 20 MG tablet, TAKE 1 TABLET BY MOUTH ONCE DAILY AS NEEDED AS DIRECTED FOR ERECTILE DYSFUNCTION, Disp: 10 tablet, Rfl: 1  No Known Allergies   Review of Systems   Pertinent items are noted in the HPI. Otherwise, a complete ROS is negative.  Vitals   Vitals:   01/22/19 1308  BP: 140/90  Pulse: 73  Temp: 99.1 F (37.3 C)  TempSrc: Oral  SpO2: 97%  Weight: 177 lb 6.1 oz (80.5 kg)  Height: 5\' 8"  (1.727 m)     Body mass index is 26.97 kg/m.  Physical Exam   Physical Exam Vitals signs and nursing note reviewed.  Constitutional:      General: He is not in acute distress.    Appearance: He is well-developed and normal weight.  HENT:     Head: Normocephalic and atraumatic.     Right Ear: External ear normal.     Left Ear: External ear normal.     Nose: Nose normal.     Mouth/Throat:     Mouth: Mucous membranes are moist.  Eyes:     Extraocular Movements: Extraocular movements intact.     Conjunctiva/sclera: Conjunctivae normal.  Neck:  Musculoskeletal: Neck supple.  Cardiovascular:     Rate and Rhythm: Normal rate and regular rhythm.  Pulmonary:     Effort: Pulmonary effort is normal.  Abdominal:     General: Bowel sounds are normal.     Palpations: Abdomen is soft.  Musculoskeletal: Normal range of motion.  Skin:    General: Skin is warm.     Capillary Refill: Capillary refill takes less than 2 seconds.     Comments: Scar tissue along left jaw-line.  Neurological:     General: No focal deficit present.     Mental Status: He is alert and oriented to person, place, and time.  Psychiatric:        Mood and Affect: Mood normal.          Behavior: Behavior normal.    Results for orders placed or performed in visit on 01/22/19  CBC with Differential/Platelet  Result Value Ref Range   WBC 7.4 4.0 - 10.5 K/uL   RBC 4.03 (L) 4.22 - 5.81 Mil/uL   Hemoglobin 13.2 13.0 - 17.0 g/dL   HCT 16.139.9 09.639.0 - 04.552.0 %   MCV 99.0 78.0 - 100.0 fl   MCHC 33.1 30.0 - 36.0 g/dL   RDW 40.914.8 81.111.5 - 91.415.5 %   Platelets 380.0 150.0 - 400.0 K/uL   Neutrophils Relative % 69.4 43.0 - 77.0 %   Lymphocytes Relative 21.4 12.0 - 46.0 %   Monocytes Relative 8.4 3.0 - 12.0 %   Eosinophils Relative 0.4 0.0 - 5.0 %   Basophils Relative 0.4 0.0 - 3.0 %   Neutro Abs 5.1 1.4 - 7.7 K/uL   Lymphs Abs 1.6 0.7 - 4.0 K/uL   Monocytes Absolute 0.6 0.1 - 1.0 K/uL   Eosinophils Absolute 0.0 0.0 - 0.7 K/uL   Basophils Absolute 0.0 0.0 - 0.1 K/uL  Comprehensive metabolic panel  Result Value Ref Range   Sodium 140 135 - 145 mEq/L   Potassium 3.8 3.5 - 5.1 mEq/L   Chloride 103 96 - 112 mEq/L   CO2 28 19 - 32 mEq/L   Glucose, Bld 93 70 - 99 mg/dL   BUN 13 6 - 23 mg/dL   Creatinine, Ser 7.821.08 0.40 - 1.50 mg/dL   Total Bilirubin 0.4 0.2 - 1.2 mg/dL   Alkaline Phosphatase 55 39 - 117 U/L   AST 20 0 - 37 U/L   ALT 13 0 - 53 U/L   Total Protein 7.0 6.0 - 8.3 g/dL   Albumin 4.1 3.5 - 5.2 g/dL   Calcium 9.3 8.4 - 95.610.5 mg/dL   GFR 21.3086.05 >86.57>60.00 mL/min  Magnesium  Result Value Ref Range   Magnesium 1.7 1.5 - 2.5 mg/dL  TSH  Result Value Ref Range   TSH 1.30 0.35 - 4.50 uIU/mL  Vitamin B12  Result Value Ref Range   Vitamin B-12 465 211 - 911 pg/mL  Sedimentation rate  Result Value Ref Range   Sed Rate 19 0 - 20 mm/hr  C-reactive protein  Result Value Ref Range   CRP <1.0 0.5 - 20.0 mg/dL   Assessment and Plan   Chrissie NoaWilliam was seen today for follow-up.  Diagnoses and all orders for this visit:  Facial infection Comments: This is a very difficult situation for me as a primary. He seems to be "stuck" between specialists. I cannot continue to prescribe  antibiotics chronically. Discussed a new plan with the patient. ID consult to help us understand this infection. Ask if plastic surgery would be  appropriate to manage surgery if needed. The patient is very happy with this plan. NOTE: The patient does understand that there is not extension of infection into the brain. Any increased neuro deficits are from CTE and (possibly) indirectly from infection/antibiotics.   Orders: -     ciprofloxacin (CIPRO) 750 MG tablet; Take 1 tablet (750 mg total) by mouth 2 (two) times daily. -     clindamycin (CLEOCIN) 150 MG capsule; TAKE 3 CAPSULES BY MOUTH EVERY 8 HOURS -     Ambulatory referral to Infectious Disease -     Ambulatory referral to Plastic Surgery -     CBC with Differential/Platelet -     Comprehensive metabolic panel -     Magnesium -     TSH -     Vitamin B12 -     Sedimentation rate -     C-reactive protein  Neurocognitive deficits -     CBC with Differential/Platelet -     Comprehensive metabolic panel -     Magnesium -     TSH -     Vitamin B12  Spondylosis of cervical region without myelopathy or radiculopathy -     diclofenac sodium (VOLTAREN) 1 % GEL; Apply 2 g topically 4 (four) times daily. -     ibuprofen (ADVIL) 600 MG tablet; Take 1 tablet (600 mg total) by mouth every 6 (six) hours as needed for moderate pain.  Erectile dysfunction, unspecified erectile dysfunction type -     sildenafil (REVATIO) 20 MG tablet; TAKE 1 TABLET BY MOUTH ONCE DAILY AS NEEDED AS DIRECTED FOR ERECTILE DYSFUNCTION  Swelling of left parotid gland   . Orders and follow up as documented in [place], reviewed diet, exercise and weight control, cardiovascular risk and specific lipid/LDL goals reviewed, reviewed medications and side effects in detail.  . Reviewed expectations re: course of current medical issues. . Outlined signs and symptoms indicating need for more acute intervention. . Patient verbalized understanding and all questions were  answered. . Patient received an After Visit Summary.  Briscoe Deutscher, DO [place], [facility] 01/26/2019

## 2019-01-22 ENCOUNTER — Other Ambulatory Visit: Payer: Self-pay

## 2019-01-22 ENCOUNTER — Ambulatory Visit (INDEPENDENT_AMBULATORY_CARE_PROVIDER_SITE_OTHER): Payer: Medicare Other | Admitting: Family Medicine

## 2019-01-22 ENCOUNTER — Encounter: Payer: Self-pay | Admitting: Family Medicine

## 2019-01-22 VITALS — BP 140/90 | HR 73 | Temp 99.1°F | Ht 68.0 in | Wt 177.4 lb

## 2019-01-22 DIAGNOSIS — R600 Localized edema: Secondary | ICD-10-CM

## 2019-01-22 DIAGNOSIS — R29818 Other symptoms and signs involving the nervous system: Secondary | ICD-10-CM | POA: Diagnosis not present

## 2019-01-22 DIAGNOSIS — M47812 Spondylosis without myelopathy or radiculopathy, cervical region: Secondary | ICD-10-CM | POA: Diagnosis not present

## 2019-01-22 DIAGNOSIS — R609 Edema, unspecified: Secondary | ICD-10-CM | POA: Diagnosis not present

## 2019-01-22 DIAGNOSIS — R4189 Other symptoms and signs involving cognitive functions and awareness: Secondary | ICD-10-CM

## 2019-01-22 DIAGNOSIS — N529 Male erectile dysfunction, unspecified: Secondary | ICD-10-CM

## 2019-01-22 DIAGNOSIS — L089 Local infection of the skin and subcutaneous tissue, unspecified: Secondary | ICD-10-CM | POA: Diagnosis not present

## 2019-01-22 LAB — COMPREHENSIVE METABOLIC PANEL
ALT: 13 U/L (ref 0–53)
AST: 20 U/L (ref 0–37)
Albumin: 4.1 g/dL (ref 3.5–5.2)
Alkaline Phosphatase: 55 U/L (ref 39–117)
BUN: 13 mg/dL (ref 6–23)
CO2: 28 mEq/L (ref 19–32)
Calcium: 9.3 mg/dL (ref 8.4–10.5)
Chloride: 103 mEq/L (ref 96–112)
Creatinine, Ser: 1.08 mg/dL (ref 0.40–1.50)
GFR: 86.05 mL/min (ref >60.00)
Glucose, Bld: 93 mg/dL (ref 70–99)
Potassium: 3.8 mEq/L (ref 3.5–5.1)
Sodium: 140 mEq/L (ref 135–145)
Total Bilirubin: 0.4 mg/dL (ref 0.2–1.2)
Total Protein: 7 g/dL (ref 6.0–8.3)

## 2019-01-22 LAB — SEDIMENTATION RATE: Sed Rate: 19 mm/hr (ref 0–20)

## 2019-01-22 LAB — CBC WITH DIFFERENTIAL/PLATELET
Basophils Absolute: 0 10*3/uL (ref 0.0–0.1)
Basophils Relative: 0.4 % (ref 0.0–3.0)
Eosinophils Absolute: 0 10*3/uL (ref 0.0–0.7)
Eosinophils Relative: 0.4 % (ref 0.0–5.0)
HCT: 39.9 % (ref 39.0–52.0)
Hemoglobin: 13.2 g/dL (ref 13.0–17.0)
Lymphocytes Relative: 21.4 % (ref 12.0–46.0)
Lymphs Abs: 1.6 10*3/uL (ref 0.7–4.0)
MCHC: 33.1 g/dL (ref 30.0–36.0)
MCV: 99 fl (ref 78.0–100.0)
Monocytes Absolute: 0.6 10*3/uL (ref 0.1–1.0)
Monocytes Relative: 8.4 % (ref 3.0–12.0)
Neutro Abs: 5.1 10*3/uL (ref 1.4–7.7)
Neutrophils Relative %: 69.4 % (ref 43.0–77.0)
Platelets: 380 10*3/uL (ref 150.0–400.0)
RBC: 4.03 Mil/uL — ABNORMAL LOW (ref 4.22–5.81)
RDW: 14.8 % (ref 11.5–15.5)
WBC: 7.4 10*3/uL (ref 4.0–10.5)

## 2019-01-22 LAB — C-REACTIVE PROTEIN: CRP: <1 mg/dL (ref 0.5–20.0)

## 2019-01-22 LAB — TSH: TSH: 1.3 u[IU]/mL (ref 0.35–4.50)

## 2019-01-22 LAB — VITAMIN B12: Vitamin B-12: 465 pg/mL (ref 211–911)

## 2019-01-22 LAB — MAGNESIUM: Magnesium: 1.7 mg/dL (ref 1.5–2.5)

## 2019-01-22 MED ORDER — CIPROFLOXACIN HCL 750 MG PO TABS: 750.0000 mg | ORAL_TABLET | Freq: Two times a day (BID) | ORAL | 0 refills | Status: DC

## 2019-01-22 MED ORDER — IBUPROFEN 600 MG PO TABS: 600.0000 mg | ORAL_TABLET | Freq: Four times a day (QID) | ORAL | 0 refills | Status: DC | PRN

## 2019-01-22 MED ORDER — SILDENAFIL CITRATE 20 MG PO TABS: ORAL_TABLET | ORAL | 1 refills | Status: DC

## 2019-01-22 MED ORDER — CLINDAMYCIN HCL 150 MG PO CAPS: ORAL_CAPSULE | ORAL | 0 refills | Status: DC

## 2019-01-22 MED ORDER — DICLOFENAC SODIUM 1 % TD GEL: 2.0000 g | Freq: Four times a day (QID) | TRANSDERMAL | 1 refills | Status: AC

## 2019-01-23 ENCOUNTER — Telehealth: Payer: Self-pay | Admitting: Infectious Disease

## 2019-01-23 NOTE — Telephone Encounter (Signed)
COVID-19 Pre-Screening Questions:01/23/19 ° °Do you currently have a fever (>100 °F), chills or unexplained body aches? NO ° °Are you currently experiencing new cough, shortness of breath, sore throat, runny nose? NO  °•  °Have you recently travelled outside the state of [place] in the last 14 days? NO °•  °Have you been in contact with someone that is currently pending confirmation of Covid19 testing or has been confirmed to have the Covid19 virus?  NO ° °**If the patient answers NO to ALL questions -  advise the patient to please call the clinic before coming to the office should any symptoms develop.  ° ° ° °

## 2019-01-26 ENCOUNTER — Encounter: Payer: Self-pay | Admitting: Family Medicine

## 2019-01-27 ENCOUNTER — Ambulatory Visit: Payer: Medicare Other | Admitting: Infectious Disease

## 2019-01-30 ENCOUNTER — Telehealth: Payer: Self-pay

## 2019-01-30 ENCOUNTER — Encounter: Payer: Self-pay | Admitting: Infectious Disease

## 2019-01-30 ENCOUNTER — Other Ambulatory Visit: Payer: Self-pay

## 2019-01-30 ENCOUNTER — Ambulatory Visit (INDEPENDENT_AMBULATORY_CARE_PROVIDER_SITE_OTHER): Payer: Medicare Other | Admitting: Infectious Disease

## 2019-01-30 VITALS — BP 171/79 | HR 96 | Temp 98.1°F

## 2019-01-30 DIAGNOSIS — R609 Edema, unspecified: Secondary | ICD-10-CM | POA: Diagnosis not present

## 2019-01-30 DIAGNOSIS — S0230XA Fracture of orbital floor, unspecified side, initial encounter for closed fracture: Secondary | ICD-10-CM

## 2019-01-30 DIAGNOSIS — F0781 Postconcussional syndrome: Secondary | ICD-10-CM | POA: Diagnosis not present

## 2019-01-30 DIAGNOSIS — K1120 Sialoadenitis, unspecified: Secondary | ICD-10-CM | POA: Diagnosis not present

## 2019-01-30 DIAGNOSIS — R600 Localized edema: Secondary | ICD-10-CM

## 2019-01-30 DIAGNOSIS — L930 Discoid lupus erythematosus: Secondary | ICD-10-CM | POA: Diagnosis not present

## 2019-01-30 MED ORDER — AMOXICILLIN 500 MG PO CAPS: 500.0000 mg | ORAL_CAPSULE | Freq: Three times a day (TID) | ORAL | 2 refills | Status: DC

## 2019-01-30 NOTE — Telephone Encounter (Signed)
-----   Message from Micheline Chapman sent at 01/30/2019 12:29 PM EDT ----- Regarding: Please call pt back as soon as you are able Pt called after he was leaving another doctors appt and was asking to speak with someone on Dr. Mick Sell team. I wasn't able to understand too much, but he is wanting a call back as soon as possible. He said something about a doctor telling him if he didn't have something removed it was going to kill him. He also said that Dr. Juleen China wanted him to see a plastic surgeon but he hasnt heard anything from them about scheduling or anything. Please call pt when you can. Thank you!

## 2019-01-30 NOTE — Progress Notes (Signed)
Subjective:    Patient ID: Darren Watson, male    DOB: 11-26-1965, 53 y.o.   MRN: [id number]  HPI  53 y.o. male with a history of cognitive/memory issues which is attributed to a head injury.  He also has a history of parotid gland swelling since 2017 has been seen by multiple ear nose and [facility].  He gives a fairly convoluted history regarding his care with some of the story a bit compromised by his cognitive defects which he is actually himself aware of.  He [facility] and saw Dr. Azucena FallenHackman.   The patient had had a prior biopsy performed in 2019 that showed Outside case, [phone], 6 slides, 06/05/2018) Skin, over left parotid, biopsy - Dermal fibrosis, lymphohistiocytic infiltrate, pigment incontinence and subcutaneous fat necrosis with calcification, see comment    Patient endorses recurrent left salivary gland swelling for the last 2 years.    He also reports having an infection which resulted in a 50 lb loss two years prior.    He tells me that when he is not on antibiotics, he is currently on ciprofloxacin and clindamycin that he has worsening swelling of his parotid gland and developed fevers and has worsening cognitive decline.  I do not see any culture data to give us a rational approach to antibiotics.  I certainly am not going to be able to cure his problem with antibiotics but we can continue to give him antibiotics to help suppress infection in the parotid gland.  I do think he is again ultimately need surgery which is been deferred due to concerns of making his cognition worse with anesthesia.     History reviewed. No pertinent past medical history.  Past Surgical History:  Procedure Laterality Date  . KNEE SURGERY Right     History reviewed. No pertinent family history.    Social History   Socioeconomic History  . Marital status: Married    Spouse name:  Not on file  . Number of children: Not on file  . Years of education: Not on file  . Highest education level: Not on file  Occupational History  . Not on file  Social Needs  . Financial resource strain: Not on file  . Food insecurity    Worry: Not on file    Inability: Not on file  . Transportation needs    Medical: Not on file    Non-medical: Not on file  Tobacco Use  . Smoking status: Never Smoker  . Smokeless tobacco: Never Used  Substance and Sexual Activity  . Alcohol use: No  . Drug use: No  . Sexual activity: Not on file  Lifestyle  . Physical activity    Days per week: Not on file    Minutes per session: Not on file  . Stress: Not on file  Relationships  . Social Musicianconnections    Talks on phone: Not on file    Gets together: Not on file    Attends religious service: Not on file    Active member of club or organization: Not on file    Attends meetings of clubs or organizations: Not on file    Relationship status: Not on file  Other Topics Concern  . Not on file  Social History Narrative   Denies tobacco, drug use. 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 Hx of workups have included HIV, all negative. No exogenous testosterone use. Take a B complex. Works at [employer]. Lives on his [place] farm, "been in my family since early 421900s." Dad was in World War, so he was taught not to complain and does "what has to be done." With parotid issues, stopped wanting meat. Down 70 pounds in 2 years.     No Known Allergies   Current Outpatient Medications:  .  diclofenac sodium (VOLTAREN) 1 % GEL, Apply 2 g topically 4 (four) times daily., Disp: 90 g, Rfl: 1 .  ibuprofen (ADVIL) 600 MG tablet, Take 1 tablet (600 mg total) by mouth every 6 (six) hours as needed for moderate pain., Disp: 30 tablet, Rfl: 0 .  sildenafil (REVATIO) 20 MG tablet, TAKE 1 TABLET BY MOUTH ONCE DAILY AS NEEDED  AS DIRECTED FOR ERECTILE DYSFUNCTION, Disp: 10 tablet, Rfl: 1 .  amoxicillin (AMOXIL) 500 MG capsule, Take 1 capsule (500 mg total) by mouth 3 (three) times daily., Disp: 90 capsule, Rfl: 2    Review of Systems  Unable to perform ROS: Dementia       Objective:   Physical Exam Constitutional:      Appearance: He is well-developed.  HENT:     Head: Normocephalic and atraumatic.  Eyes:     Conjunctiva/sclera: Conjunctivae normal.  Neck:     Musculoskeletal: Normal range of motion and neck supple.  Cardiovascular:     Rate and Rhythm: Normal rate and regular rhythm.  Pulmonary:     Effort: Pulmonary effort is normal. No respiratory distress.     Breath sounds: No wheezing.  Abdominal:     General: There is no distension.     Palpations: Abdomen is soft.  Musculoskeletal: Normal range of motion.        General: No tenderness.  Skin:    General: Skin is warm and dry.     Coloration: Skin is not pale.     Findings: No erythema or rash.  Neurological:     General: No focal deficit present.     Mental Status: He is alert and oriented to person, place, and time.  Psychiatric:        Mood and Affect: Mood normal.        Behavior: Behavior normal.        Cognition and Memory: Cognition is impaired.           Assessment & Plan:   Chronic parotid gland swelling with fibrosis:  This is not something that is solvable with antibiotics and I would think he ultimately should have surgery to fix this problem.  While there is undoubtedly can be some risk for anesthesia if he really is having infectious complications from his parotid gland that is also an unwanted problem that might result in hospitalization and problems with cognitive impairment  Superimposed infection of parotid gland per patient history: I am going to narrow him to amoxicillin and see how he does on this.  Certainly if he worsens on amoxicillin I will change to an anti-MRSA agent and see how he does on that.  I  will plan on seeing him in roughly 6 weeks time.  I spent greater than 60 minutes with the patient including greater than 50% of time in face to face counsel of the patient his mother who accompanied him regarding the nature of parotid gland infections the fact that he has had chronic problems with  parotid gland and fibrosis over years and that this is going to need surgery to correct this, review of outside records and review of radiographic images and in coordination of his care.

## 2019-01-30 NOTE — Telephone Encounter (Signed)
Darren Watson, PLEASE look into this referral!!  Thank you.

## 2019-01-30 NOTE — Telephone Encounter (Signed)
The referral was sent the same day it was placed.  I called [facility] - they sent his referral to Dr Marla Roe for review.  She would like him referred to an ENT because they are better when it comes to the parotid gland.  He sees an ENT at [facility] - but they recommend Dr Constance Holster at [facility] if he would like to see someone local.

## 2019-01-31 ENCOUNTER — Other Ambulatory Visit: Payer: Self-pay

## 2019-01-31 ENCOUNTER — Telehealth: Payer: Self-pay | Admitting: Family Medicine

## 2019-01-31 NOTE — Progress Notes (Signed)
Okay to change the referral to local ENT if okay with him. Please place referral and call patient.

## 2019-01-31 NOTE — Telephone Encounter (Signed)
Relation to pt: self  Call back number: [phone] Pharmacy: [facility] [address] [place] - [place] [address] [phone] (Phone) [phone] (Fax)     Reason for call:  Patient requesting PCP to prescribe anxiety medication due to specialist misdiagnosing his ear infection, patient states PCP is aware. Patient states medication will help with his nerves due to the severity of the findings of his ear infection. Patient states awaiting a call back from specialist regarding surgery date. Patient would like a follow up call today regarding medication request from PCP, please advise

## 2019-01-31 NOTE — Telephone Encounter (Signed)
I need a referral placed please.

## 2019-01-31 NOTE — Telephone Encounter (Signed)
I spoke with patient and he agrees to sending referral for [facility] today.

## 2019-02-03 ENCOUNTER — Other Ambulatory Visit: Payer: Self-pay

## 2019-02-03 DIAGNOSIS — R4189 Other symptoms and signs involving cognitive functions and awareness: Secondary | ICD-10-CM

## 2019-02-03 DIAGNOSIS — R29818 Other symptoms and signs involving the nervous system: Secondary | ICD-10-CM

## 2019-02-03 NOTE — Telephone Encounter (Signed)
Referral placed.

## 2019-02-03 NOTE — Progress Notes (Signed)
I called [facility] - they sent his referral to Dr Marla Roe for review.  She would like him referred to an ENT because they are better when it comes to the parotid gland.  He sees an ENT at [facility] - but they recommend Dr Constance Holster at [facility]NT if he would like to see someone local.

## 2019-02-03 NOTE — Telephone Encounter (Signed)
Pt calling back and would like to schedule an appointment. Please call back to schedule

## 2019-02-03 NOTE — Telephone Encounter (Signed)
See note, can patient see another provider? This is for anxiety medication.

## 2019-02-03 NOTE — Telephone Encounter (Signed)
L/m to call office will need app.

## 2019-02-04 NOTE — Telephone Encounter (Signed)
Need to call patient per Dr Juleen China: given history would like to referral to neuro psych to make sure treated correctly. If ok with him send referral to [facility].    Phone : [phone]

## 2019-02-05 ENCOUNTER — Other Ambulatory Visit: Payer: Self-pay

## 2019-02-05 DIAGNOSIS — F419 Anxiety disorder, unspecified: Secondary | ICD-10-CM

## 2019-02-05 DIAGNOSIS — R29818 Other symptoms and signs involving the nervous system: Secondary | ICD-10-CM

## 2019-02-05 NOTE — Telephone Encounter (Signed)
Called patient agreed to app. Have called office they require referral faxed. Have put in referral. Per office they will call patient within 24 hrs of getting referral .

## 2019-02-06 ENCOUNTER — Other Ambulatory Visit: Payer: Self-pay | Admitting: Family Medicine

## 2019-02-06 DIAGNOSIS — L089 Local infection of the skin and subcutaneous tissue, unspecified: Secondary | ICD-10-CM

## 2019-02-19 NOTE — Progress Notes (Signed)
No show

## 2019-02-21 ENCOUNTER — Telehealth: Payer: Self-pay | Admitting: Family Medicine

## 2019-02-21 NOTE — Telephone Encounter (Signed)
Tell him to keep the appointment. Explain what has happened in the past year. We MUST have ENT help at this point. Offer to send paperwork to ENT (esp ID, Neuro, last ENT notes).

## 2019-02-21 NOTE — Telephone Encounter (Signed)
Patient is calling because his was referred to the 4th doctor Dr. Constance Holster at [facility]NT. He said that Dr. Constance Holster was the 1st doctor that he saw.    Patient was told that he would die with in a year if this was not treated. Patient states that he is going on 11 months with this rare condition. And is very concerned.  Patient states that is an emergency.  An appt is established with Dr. Constance Holster for Monday and he does not feel that he should see Dr. Constance Holster. Because he started with Dr. Constance Holster.  Dr. Constance Holster said that he had never seen anything like this.   The patient feels that this whole process make no sense. He is requesting some answers.  Please advise CB- [phone]

## 2019-02-21 NOTE — Telephone Encounter (Signed)
Please see message and advise.  Thank you. ° °

## 2019-02-24 ENCOUNTER — Other Ambulatory Visit: Payer: Self-pay

## 2019-02-24 DIAGNOSIS — R600 Localized edema: Secondary | ICD-10-CM

## 2019-02-24 DIAGNOSIS — R4189 Other symptoms and signs involving cognitive functions and awareness: Secondary | ICD-10-CM

## 2019-02-24 DIAGNOSIS — R29818 Other symptoms and signs involving the nervous system: Secondary | ICD-10-CM

## 2019-02-24 NOTE — Telephone Encounter (Signed)
Pt called in wanting to speak to a nurse, transferred to [facility]

## 2019-02-24 NOTE — Telephone Encounter (Signed)
Called to let patient know needs to make app with Bel[facility] Bertin for ENT per patients requests. Did not want to see anyone in this area.

## 2019-02-28 ENCOUNTER — Telehealth: Payer: Self-pay | Admitting: Family Medicine

## 2019-02-28 NOTE — Telephone Encounter (Signed)
Called him to explain that [employer] isn't participating with his insurance. The [place] location will do a consult, but no Darren Watson location will do any procedures or surgeries - they will send him to an outside office for that.  He stated that he didn't care and wanted to be sent there anyways.

## 2019-02-28 NOTE — Telephone Encounter (Signed)
Pt tried explaining that we changed the dose of one of his medications - he doesn't know the name of it, but said "its similar to Viagra - its supposed to get your blood pumping" but said this was for the issue with his mouth.  I apologize that I don't have better information for you guys.  He said it was definitely Dr Juleen China that ordered it.

## 2019-03-03 ENCOUNTER — Telehealth: Payer: Self-pay | Admitting: Physical Therapy

## 2019-03-03 NOTE — Telephone Encounter (Signed)
Copied from [place] [id number]. Topic: General - Other >> Mar 03, 2019  3:09 PM Antonieta Iba C wrote: Reason for CRM: Pt called in to speak with PCP's assistant. Called the office, got Joellen, pt disconnected before able to transfer.

## 2019-03-03 NOTE — Telephone Encounter (Signed)
See note

## 2019-03-03 NOTE — Telephone Encounter (Signed)
See open message after call dropped tried to call back l/m to call office.

## 2019-03-03 NOTE — Telephone Encounter (Signed)
Pt called again, stated that he did not get call.  Pt is upset , this pt keeps calling and calling just fyi.

## 2019-03-03 NOTE — Telephone Encounter (Signed)
Called l/m to call office  

## 2019-03-03 NOTE — Telephone Encounter (Signed)
Pt called in and would like call back asap  Best number  [phone]

## 2019-03-04 NOTE — Telephone Encounter (Signed)
Patient states that the Sildenafil should have been called in as 20mg  daily. Ok to change? States that that is how we have called in past. Let patient know that as far as I can see we only called in 1 time for him for 20mg  #10 with 1 Rf  Let patient know that I am going to check with you and give definate instructions as to next step. I have spent over 54min on the phone reviewing all history. Patient was very unorganized in thought pattern. Did not remember what we were talking about from one sentience to the other.

## 2019-03-04 NOTE — Telephone Encounter (Signed)
See note

## 2019-03-05 NOTE — Telephone Encounter (Signed)
Okay for Sildenafil 20 mg po daily prn ED, #30, 2 refills. Make sure he knows that I am transitioning to a new clinic. TOC to another physician.

## 2019-03-12 ENCOUNTER — Ambulatory Visit (INDEPENDENT_AMBULATORY_CARE_PROVIDER_SITE_OTHER): Payer: Medicare Other | Admitting: Infectious Disease

## 2019-03-12 ENCOUNTER — Other Ambulatory Visit: Payer: Self-pay

## 2019-03-12 VITALS — BP 162/82 | HR 69 | Temp 98.6°F

## 2019-03-12 DIAGNOSIS — R29818 Other symptoms and signs involving the nervous system: Secondary | ICD-10-CM

## 2019-03-12 DIAGNOSIS — R609 Edema, unspecified: Secondary | ICD-10-CM

## 2019-03-12 DIAGNOSIS — R600 Localized edema: Secondary | ICD-10-CM

## 2019-03-12 DIAGNOSIS — R4189 Other symptoms and signs involving cognitive functions and awareness: Secondary | ICD-10-CM

## 2019-03-12 DIAGNOSIS — L930 Discoid lupus erythematosus: Secondary | ICD-10-CM

## 2019-03-12 NOTE — Progress Notes (Signed)
Subjective:    Patient ID: Darren Watson, male    DOB: 1965/10/17, 53 y.o.   MRN: [id number]  HPI  53 y.o. male with a history of cognitive/memory issues which is attributed to a head injury.  He also has a history of parotid gland swelling since 2017 has been seen by multiple ear nose and throat surgeons including one at [facility] and 1 at [facility].  He gives a fairly convoluted history regarding his care with some of the story a bit compromised by his cognitive defects which he is actually himself aware of.  He most recently saw ear nose and throat at [facility] and saw Dr.  Callas.   The patient had had a prior biopsy performed in 2019 that showed Outside case, [id number], 6 slides, 06/05/2018) Skin, over left parotid, biopsy - Dermal fibrosis, lymphohistiocytic infiltrate, pigment incontinence and subcutaneous fat necrosis with calcification, see comment    Patient endorsed recurrent left salivary gland swelling for the last 2 years.    He also reported having an infection which resulted in a 50 lb loss two years prior.    He told me his first visit that when he is not on antibiotics, and he had previuosly was on iprofloxacin and clindamycin that he has worsening swelling of his parotid gland and developed fevers and has worsening cognitive decline.  I do not see any culture data to give [place] a rational approach to antibiotics.  I explained to him that I could not cure his problem with antibiotics, and had narrowed him to amoxicillin.  Today he claims that his swelling in the parotid gland area is not significantly worse but not markedly changed either.  I would like to trial him off antibiotics to see how he does.  He is very apprehensive about this      No past medical history on file.  Past Surgical History:  Procedure Laterality Date  . KNEE SURGERY Right     No family history on file.    Social History   Socioeconomic History  . Marital status:  Married    Spouse name: Not on file  . Number of children: Not on file  . Years of education: Not on file  . Highest education level: Not on file  Occupational History  . Not on file  Social Needs  . Financial resource strain: Not on file  . Food insecurity    Worry: Not on file    Inability: Not on file  . Transportation needs    Medical: Not on file    Non-medical: Not on file  Tobacco Use  . Smoking status: Never Smoker  . Smokeless tobacco: Never Used  Substance and Sexual Activity  . Alcohol use: No  . Drug use: No  . Sexual activity: Not on file  Lifestyle  . Physical activity    Days per week: Not on file    Minutes per session: Not on file  . Stress: Not on file  Relationships  . Social Herbalist on phone: Not on file    Gets together: Not on file    Attends religious service: Not on file    Active member of club or organization: Not on file    Attends meetings of clubs or organizations: Not on file    Relationship status: Not on file  Other Topics Concern  . Not on file  Social History Narrative   Denies tobacco, drug use. 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 Hx of workups have included HIV, all negative. No exogenous testosterone use. Take a B complex. Works at [employer]. [place] his [place] farm, "been in my family since early [id number]." Dad was in World War, so he was taught not to complain and does "what has to be done." With parotid issues, stopped wanting meat. Down 70 pounds in 2 years.     No Known Allergies   Current Outpatient Medications:  .  ALPRAZolam (XANAX) 0.5 MG tablet, TALE ONE HALF TO ONE TABLET BY MOUTH TWICE DAILY AS NEEDED FOR SEVERE ANXIETY, Disp: , Rfl:  .  amoxicillin (AMOXIL) 500 MG capsule, Take 1 capsule (500 mg total) by mouth 3 (three) times daily., Disp: 90 capsule, Rfl: 2 .  diclofenac sodium (VOLTAREN) 1 % GEL, Apply 2 g  topically 4 (four) times daily., Disp: 90 g, Rfl: 1 .  ibuprofen (ADVIL) 600 MG tablet, Take 1 tablet (600 mg total) by mouth every 6 (six) hours as needed for moderate pain., Disp: 30 tablet, Rfl: 0 .  sildenafil (REVATIO) 20 MG tablet, TAKE 1 TABLET BY MOUTH ONCE DAILY AS NEEDED AS DIRECTED FOR ERECTILE DYSFUNCTION, Disp: 10 tablet, Rfl: 1    Review of Systems  Unable to perform ROS: Dementia       Objective:   Physical Exam Constitutional:      Appearance: He is well-developed.  HENT:     Head: Normocephalic and atraumatic.  Eyes:     Conjunctiva/sclera: Conjunctivae normal.  Neck:     Musculoskeletal: Normal range of motion and neck supple.  Cardiovascular:     Rate and Rhythm: Normal rate and regular rhythm.  Pulmonary:     Effort: Pulmonary effort is normal. No respiratory distress.     Breath sounds: No wheezing.  Abdominal:     General: There is no distension.     Palpations: Abdomen is soft.  Musculoskeletal: Normal range of motion.        General: No tenderness.  Skin:    General: Skin is warm and dry.     Coloration: Skin is not pale.     Findings: No erythema or rash.  Neurological:     General: No focal deficit present.     Mental Status: He is alert and oriented to person, place, and time.  Psychiatric:        Mood and Affect: Mood normal.        Behavior: Behavior normal.        Cognition and Memory: Cognition is impaired.           Assessment & Plan:   Chronic parotid gland swelling with fibrosis:  This is not something that is solvable with antibiotics and I would think he ultimately should have surgery to fix this problem.  While there is undoubtedly can be some risk for anesthesia if he really is having infectious complications from his parotid gland that is also an unwanted problem that might result in hospitalization and problems with cognitive impairment  Superimposed infection of parotid gland per patient history:    I am going to ask  to trial him off of antibiotics and see him before the end of the month to see what happens when he goes off antibiotics.  I would like to have some more objective evidence that he really does truly worsen off antibiotics

## 2019-03-12 NOTE — Telephone Encounter (Signed)
Left message to return call to our office.  

## 2019-03-14 ENCOUNTER — Other Ambulatory Visit: Payer: Self-pay | Admitting: Family Medicine

## 2019-03-14 DIAGNOSIS — M47812 Spondylosis without myelopathy or radiculopathy, cervical region: Secondary | ICD-10-CM

## 2019-03-17 ENCOUNTER — Telehealth: Payer: Self-pay

## 2019-03-17 NOTE — Telephone Encounter (Signed)
He can come and see me on WEdnesday

## 2019-03-17 NOTE — Telephone Encounter (Signed)
Patient called and stated since being off his antibiotics (amoxcillin for trial period) he feels like his head is "cloudy" and he is feeling pressure behind his eye. Patient states the last time he taking off of his antibiotics he had the same type of "episodes".  Patient denies any other s/sx. Routing message to provider for advise.

## 2019-03-17 NOTE — Telephone Encounter (Signed)
Last fill 01/22/19  #30/0 Last OV 01/22/19

## 2019-03-19 ENCOUNTER — Telehealth: Payer: Self-pay

## 2019-03-19 ENCOUNTER — Other Ambulatory Visit: Payer: Self-pay

## 2019-03-19 ENCOUNTER — Ambulatory Visit (INDEPENDENT_AMBULATORY_CARE_PROVIDER_SITE_OTHER): Payer: Medicare Other | Admitting: Infectious Disease

## 2019-03-19 ENCOUNTER — Encounter: Payer: Self-pay | Admitting: Infectious Disease

## 2019-03-19 DIAGNOSIS — R4189 Other symptoms and signs involving cognitive functions and awareness: Secondary | ICD-10-CM

## 2019-03-19 DIAGNOSIS — R609 Edema, unspecified: Secondary | ICD-10-CM | POA: Diagnosis not present

## 2019-03-19 DIAGNOSIS — R29818 Other symptoms and signs involving the nervous system: Secondary | ICD-10-CM

## 2019-03-19 DIAGNOSIS — R600 Localized edema: Secondary | ICD-10-CM

## 2019-03-19 MED ORDER — AMOXICILLIN 500 MG PO CAPS: 500.0000 mg | ORAL_CAPSULE | Freq: Three times a day (TID) | ORAL | 0 refills | Status: DC

## 2019-03-19 NOTE — Telephone Encounter (Signed)
Per Dr. Tommy Medal called patient to see if he would be able to come in for in person appointment today. Patient states he does not have transportation, and is afraid to drive since he is having issues with memory. Patient would prefer to do visit via phone call or video. Verbalized to patient that in person appointment would be preferred by MD to be evaluated regarding concerns relayed to LPN.  Patient is unable to do in person appointment. Per Dr. Tommy Medal will try video for todays appointment. If unable to complete patient will need to reschedule. [place]

## 2019-03-19 NOTE — Progress Notes (Signed)
Virtual Visit via Telephone Note  I connected with Darren Watson on 03/19/19 at 11:15 AM EDT by telephone and verified that I am speaking with the correct person using two identifiers.  Location: Patient: Home Provider: [facility]   I discussed the limitations, risks, security and privacy concerns of performing an evaluation and management service by telephone and the availability of in person appointments. I also discussed with the patient that there may be a patient responsible charge related to this service. The patient expressed understanding and agreed to proceed.  Chief complaint the infection in my brain is returning  History of Present Illness:    53 y.o. male with a history of cognitive/memory issues which is attributed to a head injury.  He also has a history of parotid gland swelling since 2017 has been seen by multiple ear nose and throat surgeons including one at [facility] and 1 at [facility].  He gives a fairly convoluted history regarding his care with some of the story a bit compromised by his cognitive defects which he is actually himself aware of.  He most recently saw ear nose and throat at [facility] and saw Dr. [place] Callas.   The patient had had a prior biopsy performed in 2019 that showed Outside case, [phone], 6 slides, 06/05/2018) Skin, over left parotid, biopsy - Dermal fibrosis, lymphohistiocytic infiltrate, pigment incontinence and subcutaneous fat necrosis with calcification, see comment    Patient endorsed recurrent left salivary gland swelling for the last 2 years.    He also reported having an infection which resulted in a 50 lb loss two years prior.    He told me his first visit that when he is not on antibiotics, and he had previuosly was on iprofloxacin and clindamycin that he has worsening swelling of his parotid gland and developed fevers and has worsening cognitive decline.  I did not see any culture data to give [place] a rational approach to  antibiotics.  I explained to him that I could not cure his problem with antibiotics, and had narrowed him to amoxicillin.  At last visit he claims that his swelling in the parotid gland area is not significantly worse but not markedly changed either. I had wished to take him off of the antibiotics but he was very apprehensive about this  This was approximately 7 days ago.  Apparently according to him he has subsequently worsened.  When I asked him what he meant by worsening he is telling me that he is now having loose stools and that he is thinking less clearly with every third sentence being less clear.  He tells me that he went on like this for more than a year due to "in for an infection".  Emphasized that infections caused by typical bacteria do not cause such symptoms and if they interfere with thinking usually cause abrupt clinical deterioration.  It turns out that he is not off antibiotics after all but is resumed his clindamycin and levofloxacin which was not what I wanted him to be doing.  I really wanted to see how we did off of the antibiotics.  He continue to perseverate about several things such as needing a neurosurgeon because he thinks he has an infection in the brain.  We again discussed his problems with his parotid gland which he then mentioned was not infected which I responded what was the reason for him to get antimicrobials.  We then had further discussions about surgical removal of the scar tissue in  his parotid gland which is 1 ENT doctors not wanted to do due to his concerns about potential cognitive deficits under anesthesia.  I do not see a good way around convincing this patient that his neurocognitive issues are not related to his parotid gland and I do not see a great way to convince him that he should be on antibiotics I keep telling him I do not want him to be taking them psych observe how he does them but he ignores my advice.  Warned about the dangers of  clindamycin and ciprofloxacin for concern for risk for C. difficile colitis.  I told him I would write a one-time prescription for amoxicillin for a month that he could have but I was going to defer further care and antibiotics to his primary care physician since he is not willing to work with me in the stepwise manner that I wish to proceed with.    Diarrhea, temp is 99 degrees Observations/Objective:  53 year old with cognitive deficits and perseverations around a relationship between some type of infection whether it be in the parotid gland or in his actual brain for which he believes he needs chronic antibiotics  Assessment and Plan:  Swollen parotid gland with scar tissue: I am not disputing this might be a nidus for infection but this is not going to be solved unless he has a surgical intervention.  Is also not clear to me that he even has an infection here.  Neurocognitive deficits: These are absolutely unrelated to his parotid gland and I do not think they have any relationship to an infection either.    Follow Up Instructions:    I discussed the assessment and treatment plan with the patient. The patient was provided an opportunity to ask questions and all were answered. The patient agreed with the plan and demonstrated an understanding of the instructions.   The patient was advised to call back or seek an in-person evaluation if the symptoms worsen or if the condition fails to improve as anticipated.  I provided 22 minutes of non-face-to-face time during this encounter.   Acey Lavornelius Van Dam, MD

## 2019-03-22 ENCOUNTER — Other Ambulatory Visit: Payer: Self-pay

## 2019-03-22 DIAGNOSIS — N529 Male erectile dysfunction, unspecified: Secondary | ICD-10-CM

## 2019-03-22 MED ORDER — SILDENAFIL CITRATE 20 MG PO TABS: ORAL_TABLET | ORAL | 2 refills | Status: DC

## 2019-03-22 NOTE — Telephone Encounter (Signed)
Per phone note ok to give refill never sent pleas send now.

## 2019-03-25 ENCOUNTER — Ambulatory Visit: Payer: Medicare Other | Admitting: Family Medicine

## 2019-03-25 NOTE — Progress Notes (Deleted)
RIYAAN HEROUX is a 53 y.o. male is here for follow up.  History of Present Illness:   (SCRIBE ATTESTATION)  HPI:   Health Maintenance Due  Topic Date Due  . HIV Screening  07/26/1979  . COLONOSCOPY  07/25/2014  . TETANUS/TDAP  07/03/2016  . INFLUENZA VACCINE  02/01/2019   Depression screen PHQ 2/9 03/12/2019  Decreased Interest 0  Down, Depressed, Hopeless 1  PHQ - 2 Score 1   PMHx, SurgHx, SocialHx, FamHx, Medications, and Allergies were reviewed in the Visit Navigator and updated as appropriate.   Patient Active Problem List   Diagnosis Date Noted  . History of orbital floor (blow-out) closed fracture (HCC) 09/24/2018  . Neurocognitive deficits 09/19/2018  . 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 Swelling of left parotid gland 09/19/2018  . 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 Discoid lupus erythematosus 04/30/2018  . Enthesopathy 01/04/2017  . Biceps rupture, distal 02/25/2016  . Erectile dysfunction, uses prn Revatio 12/24/2011  . History of tear of ACL (anterior cruciate ligament) 12/24/2011   Social History   Tobacco Use  . Smoking status: Never Smoker  . Smokeless tobacco: Never Used  Substance Use Topics  . Alcohol use: No  . Drug use: No   Current Medications and Allergies   Current Outpatient Medications:  .  ALPRAZolam (XANAX) 0.5 MG tablet, TALE ONE HALF TO ONE TABLET BY MOUTH TWICE DAILY AS NEEDED FOR SEVERE ANXIETY, Disp: , Rfl:  .  amoxicillin (AMOXIL) 500 MG capsule, Take 1 capsule (500 mg total) by mouth 3 (three) times daily., Disp: 90 capsule, Rfl: 0 .  diclofenac sodium (VOLTAREN) 1 % GEL, Apply 2 g topically 4 (four) times daily., Disp: 90 g, Rfl: 1 .  ibuprofen (ADVIL) 600 MG tablet, TAKE 1 TABLET BY MOUTH EVERY 6 HOURS AS NEEDED FOR MODERATE PAIN, Disp: 30 tablet, Rfl: 0 .  sildenafil (REVATIO) 20 MG tablet, TAKE 1 TABLET BY MOUTH ONCE DAILY AS NEEDED AS DIRECTED FOR ERECTILE DYSFUNCTION, Disp: 30 tablet, Rfl: 2  No  Known Allergies Review of Systems   Pertinent items are noted in the HPI. Otherwise, a complete ROS is negative.  Vitals  There were no vitals filed for this visit.   There is no height or weight on file to calculate BMI.  Physical Exam   Physical Exam  Results for orders placed or performed in visit on 01/22/19  CBC with Differential/Platelet  Result Value Ref Range   WBC 7.4 4.0 - 10.5 K/uL   RBC 4.03 (L) 4.22 - 5.81 Mil/uL   Hemoglobin 13.2 13.0 - 17.0 g/dL   HCT 44.3 15.4 - 00.8 %   MCV 99.0 78.0 - 100.0 fl   MCHC 33.1 30.0 - 36.0 g/dL   RDW 67.6 19.5 - 09.3 %   Platelets 380.0 150.0 - 400.0 K/uL   Neutrophils Relative % 69.4 43.0 - 77.0 %   Lymphocytes Relative 21.4 12.0 - 46.0 %   Monocytes Relative 8.4 3.0 - 12.0 %   Eosinophils Relative 0.4 0.0 - 5.0 %   Basophils Relative 0.4 0.0 - 3.0 %   Neutro Abs 5.1 1.4 - 7.7 K/uL   Lymphs Abs 1.6 0.7 - 4.0 K/uL   Monocytes Absolute 0.6 0.1 - 1.0 K/uL   Eosinophils Absolute 0.0 0.0 - 0.7 K/uL   Basophils Absolute 0.0 0.0 - 0.1 K/uL  Comprehensive metabolic panel  Result Value Ref Range   Sodium 140 135 - 145 mEq/L   Potassium 3.8 3.5 -  5.1 mEq/L   Chloride 103 96 - 112 mEq/L   CO2 28 19 - 32 mEq/L   Glucose, Bld 93 70 - 99 mg/dL   BUN 13 6 - 23 mg/dL   Creatinine, Ser 1.08 0.40 - 1.50 mg/dL   Total Bilirubin 0.4 0.2 - 1.2 mg/dL   Alkaline Phosphatase 55 39 - 117 U/L   AST 20 0 - 37 U/L   ALT 13 0 - 53 U/L   Total Protein 7.0 6.0 - 8.3 g/dL   Albumin 4.1 3.5 - 5.2 g/dL   Calcium 9.3 8.4 - 10.5 mg/dL   GFR 86.05 >60.00 mL/min  Magnesium  Result Value Ref Range   Magnesium 1.7 1.5 - 2.5 mg/dL  TSH  Result Value Ref Range   TSH 1.30 0.35 - 4.50 uIU/mL  Vitamin B12  Result Value Ref Range   Vitamin B-12 465 211 - 911 pg/mL  Sedimentation rate  Result Value Ref Range   Sed Rate 19 0 - 20 mm/hr  C-reactive protein  Result Value Ref Range   CRP <1.0 0.5 - 20.0 mg/dL    Assessment and Plan   There are no  diagnoses linked to this encounter.  . Orders and follow up as documented in [place], reviewed diet, exercise and weight control, cardiovascular risk and specific lipid/LDL goals reviewed, reviewed medications and side effects in detail.  . Reviewed expectations re: course of current medical issues. . Outlined signs and symptoms indicating need for more acute intervention. . Patient verbalized understanding and all questions were answered. . Patient received an After Visit Summary.  *** CMA served as Education administrator during this visit. History, Physical, and Plan performed by medical provider. The above documentation has been reviewed and is accurate and complete. Briscoe Deutscher, D.O.  Briscoe Deutscher, DO [place], [facility] 03/25/2019

## 2019-03-31 ENCOUNTER — Telehealth: Payer: Self-pay

## 2019-03-31 ENCOUNTER — Ambulatory Visit: Payer: Medicare Other | Admitting: Infectious Disease

## 2019-04-03 NOTE — Telephone Encounter (Signed)
Error

## 2019-04-04 ENCOUNTER — Ambulatory Visit (INDEPENDENT_AMBULATORY_CARE_PROVIDER_SITE_OTHER): Payer: Medicare Other | Admitting: Family Medicine

## 2019-04-04 ENCOUNTER — Encounter: Payer: Self-pay | Admitting: Family Medicine

## 2019-04-04 ENCOUNTER — Ambulatory Visit: Payer: Medicare Other | Admitting: Family Medicine

## 2019-04-04 ENCOUNTER — Other Ambulatory Visit: Payer: Self-pay

## 2019-04-04 VITALS — BP 110/64 | HR 70 | Temp 98.6°F | Ht 68.0 in | Wt 178.2 lb

## 2019-04-04 DIAGNOSIS — L930 Discoid lupus erythematosus: Secondary | ICD-10-CM

## 2019-04-04 DIAGNOSIS — M729 Fibroblastic disorder, unspecified: Secondary | ICD-10-CM | POA: Diagnosis not present

## 2019-04-04 DIAGNOSIS — R600 Localized edema: Secondary | ICD-10-CM

## 2019-04-04 NOTE — Progress Notes (Signed)
Rod MaeWilliam H Levi is a 53 y.o. male is here for follow up.  History of Present Illness:   Barnie MortJoEllen Thompson, CMA acting as scribe for Dr. Helane RimaErica Everleigh Colclasure.   HPI: Patient in office for follow up. He is upset that the plan that we have discussed has not had follow through. We have explained to him that we have tried to make appointments but the plastic surgeon did not think that he needed to be seen by that office. He has been seen by [facility]. Their office started Xanax.   He has gone in length again about his history. We have informed him that we have tried to get him in with several ENT in our area but none will accept him as a patient due to all the past ENT that he has seen. He feels like a lot of the issue is something in the chart "on a corporate level." He believes that he is "collateral damage."  Dr. Earlene PlaterWallace reviewed in length why there are delays in getting appointments. She reviewed all other doctors notes that he had had a different impression of what the past doctors have informed him. He is becoming very upset and frustrated about not having a next step.   He has had a decrease in daily living activities. Not able to work out as much as he has in the past. He is having increased issues with memory and diet.   When he was sent [place] [facility]. He was told by that ENT that he was sending him quietly. He feel that [facility] and told [place] that made him not want to do surgery.   Patient is upset with infections disease because he wanted to stay on the amoxicillin. He feels like when he goes off of it he becomes more weak. ID did not refill. Patient had some at home so he restarted it. When he told the [facility] doctor that he agreed to give it to him.   Patient and Dr. Earlene PlaterWallace has agreed that patient will try referral to Dermatology. We have offered to get him back in with ENT that he originally seen at [facility]. He did not want  to go back due to the fact that that doctor told him there was nothing he can do for him. He for does not ever want to go back to [place]. He has agreed to getting an MRI. Last CT were 06/30/2016, 04/20/2018 and 08/16/2018. He has increased symptoms recently. No improvement at all in symptoms.   Health Maintenance Due  Topic Date Due  . HIV Screening  07/26/1979  . COLONOSCOPY  07/25/2014  . TETANUS/TDAP  07/03/2016  . INFLUENZA VACCINE  02/01/2019   [facility] 04/04/2019 03/12/2019  Decreased Interest 3 0  Down, Depressed, Hopeless 3 1  PHQ - 2 Score 6 1  Altered sleeping 3 -  Tired, decreased energy 3 -  Change in appetite 3 -  Feeling bad or failure about yourself  3 -  Trouble concentrating 3 -  Moving slowly or fidgety/restless 3 -  Suicidal thoughts 0 -  PHQ-9 Score 24 -  Difficult doing work/chores Very difficult -   PMHx, SurgHx, SocialHx, FamHx, Medications, and Allergies were reviewed in the Visit Navigator and updated as appropriate.   Patient Active Problem List   Diagnosis Date Noted  . Fasciitis 04/04/2019  . History of orbital floor (blow-out) closed fracture (HCC) 09/24/2018  .  Neurocognitive deficits 09/19/2018  . 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 Swelling of left parotid gland 09/19/2018  . 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 Discoid lupus erythematosus 04/30/2018  . Enthesopathy 01/04/2017  . Biceps rupture, distal 02/25/2016  . Erectile dysfunction, uses prn Revatio 12/24/2011  . History of tear of ACL (anterior cruciate ligament) 12/24/2011   Social History   Tobacco Use  . Smoking status: Never Smoker  . Smokeless tobacco: Never Used  Substance Use Topics  . Alcohol use: No  . Drug use: No   Current Medications and Allergies   Current Outpatient Medications:  .  ALPRAZolam (XANAX) 0.5 MG tablet, TALE ONE HALF TO ONE TABLET BY MOUTH TWICE DAILY AS NEEDED FOR SEVERE ANXIETY, Disp: , Rfl:  .  amoxicillin  (AMOXIL) 500 MG capsule, Take 1 capsule (500 mg total) by mouth 3 (three) times daily., Disp: 90 capsule, Rfl: 0 .  diclofenac sodium (VOLTAREN) 1 % GEL, Apply 2 g topically 4 (four) times daily., Disp: 90 g, Rfl: 1 .  ibuprofen (ADVIL) 600 MG tablet, TAKE 1 TABLET BY MOUTH EVERY 6 HOURS AS NEEDED FOR MODERATE PAIN, Disp: 30 tablet, Rfl: 0 .  sildenafil (REVATIO) 20 MG tablet, Take 20 mg by mouth 3 (three) times daily., Disp: , Rfl:   No Known Allergies Review of Systems   Pertinent items are noted in the HPI. Otherwise, a complete ROS is negative.  Vitals   Vitals:   04/04/19 0759  BP: 110/64  Pulse: 70  Temp: 98.6 F (37 C)  TempSrc: Temporal  SpO2: 94%  Weight: 178 lb 3.2 oz (80.8 kg)  Height: 5\' 8"  (1.727 m)     Body mass index is 27.1 kg/m.  Physical Exam   Physical Exam Vitals signs and nursing note reviewed.  Constitutional:      General: He is not in acute distress.    Appearance: He is well-developed.  HENT:     Head: Normocephalic and atraumatic.     Right Ear: External ear normal.     Left Ear: External ear normal.     Nose: Nose normal.  Eyes:     Conjunctiva/sclera: Conjunctivae normal.     Pupils: Pupils are equal, round, and reactive to light.  Neck:     Musculoskeletal: Neck supple.  Cardiovascular:     Rate and Rhythm: Normal rate and regular rhythm.  Pulmonary:     Effort: Pulmonary effort is normal.  Abdominal:     General: Bowel sounds are normal.     Palpations: Abdomen is soft.  Musculoskeletal: Normal range of motion.  Skin:    General: Skin is warm.     Comments: Scarring left jaw line. Tender.  Neurological:     Mental Status: He is alert.  Psychiatric:        Behavior: Behavior normal.     Results for orders placed or performed in visit on 01/22/19  CBC with Differential/Platelet  Result Value Ref Range   WBC 7.4 4.0 - 10.5 K/uL   RBC 4.03 (L) 4.22 - 5.81 Mil/uL   Hemoglobin 13.2 13.0 - 17.0 g/dL   HCT 01/24/19 92.4 - 26.8 %    MCV 99.0 78.0 - 100.0 fl   MCHC 33.1 30.0 - 36.0 g/dL   RDW 34.1 96.2 - 22.9 %   Platelets 380.0 150.0 - 400.0 K/uL   Neutrophils Relative % 69.4 43.0 - 77.0 %   Lymphocytes Relative 21.4 12.0 - 46.0 %   Monocytes Relative 8.4  3.0 - 12.0 %   Eosinophils Relative 0.4 0.0 - 5.0 %   Basophils Relative 0.4 0.0 - 3.0 %   Neutro Abs 5.1 1.4 - 7.7 K/uL   Lymphs Abs 1.6 0.7 - 4.0 K/uL   Monocytes Absolute 0.6 0.1 - 1.0 K/uL   Eosinophils Absolute 0.0 0.0 - 0.7 K/uL   Basophils Absolute 0.0 0.0 - 0.1 K/uL  Comprehensive metabolic panel  Result Value Ref Range   Sodium 140 135 - 145 mEq/L   Potassium 3.8 3.5 - 5.1 mEq/L   Chloride 103 96 - 112 mEq/L   CO2 28 19 - 32 mEq/L   Glucose, Bld 93 70 - 99 mg/dL   BUN 13 6 - 23 mg/dL   Creatinine, Ser 1.08 0.40 - 1.50 mg/dL   Total Bilirubin 0.4 0.2 - 1.2 mg/dL   Alkaline Phosphatase 55 39 - 117 U/L   AST 20 0 - 37 U/L   ALT 13 0 - 53 U/L   Total Protein 7.0 6.0 - 8.3 g/dL   Albumin 4.1 3.5 - 5.2 g/dL   Calcium 9.3 8.4 - 10.5 mg/dL   GFR 86.05 >60.00 mL/min  Magnesium  Result Value Ref Range   Magnesium 1.7 1.5 - 2.5 mg/dL  TSH  Result Value Ref Range   TSH 1.30 0.35 - 4.50 uIU/mL  Vitamin B12  Result Value Ref Range   Vitamin B-12 465 211 - 911 pg/mL  Sedimentation rate  Result Value Ref Range   Sed Rate 19 0 - 20 mm/hr  C-reactive protein  Result Value Ref Range   CRP <1.0 0.5 - 20.0 mg/dL    Assessment and Plan   Zeth was seen today for follow-up.  Diagnoses and all orders for this visit:  Swelling of left parotid gland -     Ambulatory referral to ENT -     MR NECK SOFT TISSUE ONLY WO CONTRAST  Discoid lupus erythematosus -     Ambulatory referral to Dermatology -     MR NECK SOFT TISSUE ONLY WO CONTRAST  Fasciitis -     Ambulatory referral to Dermatology -     Ambulatory referral to ENT -     MR NECK SOFT TISSUE ONLY WO CONTRAST   . Orders and follow up as documented in [place], reviewed diet, exercise and  weight control, cardiovascular risk and specific lipid/LDL goals reviewed, reviewed medications and side effects in detail.  . Reviewed expectations re: course of current medical issues. . Outlined signs and symptoms indicating need for more acute intervention. . Patient verbalized understanding and all questions were answered. . Patient received an After Visit Summary.  CMA served as Education administrator during this visit. History, Physical, and Plan performed by medical provider. The above documentation has been reviewed and is accurate and complete. Briscoe Deutscher, D.O.  Briscoe Deutscher, DO [place], [facility] 04/04/2019

## 2019-04-23 ENCOUNTER — Telehealth: Payer: Self-pay | Admitting: Family Medicine

## 2019-04-23 ENCOUNTER — Other Ambulatory Visit: Payer: Self-pay

## 2019-04-23 DIAGNOSIS — L930 Discoid lupus erythematosus: Secondary | ICD-10-CM

## 2019-04-23 DIAGNOSIS — M729 Fibroblastic disorder, unspecified: Secondary | ICD-10-CM

## 2019-04-23 DIAGNOSIS — R600 Localized edema: Secondary | ICD-10-CM

## 2019-04-23 DIAGNOSIS — L089 Local infection of the skin and subcutaneous tissue, unspecified: Secondary | ICD-10-CM

## 2019-04-23 DIAGNOSIS — R221 Localized swelling, mass and lump, neck: Secondary | ICD-10-CM

## 2019-04-23 NOTE — Telephone Encounter (Signed)
Called patient gave all contact information with referrals. He wanted to start back on the clindamycin 750. I recommended that he call ID and get refill but states that he was told at last visit with him that it he had follow up with Dr. Juleen China that it needs to be addressed with her. He also wants to know what doc you want to change to PCP.

## 2019-04-23 NOTE — Telephone Encounter (Signed)
Spoke to someone at [place] needs to be changed to w/ and w/o - spoke to [place] about this.  Spoke to Mr Dolin - he also relayed the message about about the MRI - I told him we were going to fix the order. He then stated that he needed to speak with the CMA about an issue he has been having.  He stated that in 30 days, he went from weighing 230 to weighing 155 ; In less than a week, he went from 169 to 198 ; and in one day, he went from 198 to 182.  Transferred the call to [facility].

## 2019-04-23 NOTE — Telephone Encounter (Signed)
Called imaging center l/m to call office.

## 2019-04-23 NOTE — Telephone Encounter (Signed)
See note  Copied from [place] [phone]. Topic: General - Inquiry >> Apr 23, 2019  1:01 PM Mathis Bud wrote: Reason for CRM: Dewayne Hatch called from [facility] needed further info about patients MRI order  Call back [phone] after 2pm. She is taking lunch now.

## 2019-04-23 NOTE — Telephone Encounter (Signed)
See note  Copied from [place] [phone]. Topic: General - Other >> Apr 23, 2019 12:32 PM Celene Kras A wrote: Reason for CRM: Pt called stating he is needing to speak with a nurse regarding his mri tomorrow. Pt states he is having lots of health issues and that the mri is not covering the whole area he is needing looked at. Pt states he needs a call back asap. Please advise.

## 2019-04-24 NOTE — Telephone Encounter (Signed)
I will not prescribe the antibiotic based on ID recommendations. I think that Dr. Rogers Blocker would do a great job of handling him.

## 2019-04-26 ENCOUNTER — Inpatient Hospital Stay: Admission: RE | Admit: 2019-04-26 | Payer: Medicare Other | Source: Ambulatory Visit

## 2019-04-28 NOTE — Telephone Encounter (Signed)
Left message to return call to our office.  

## 2019-05-01 DIAGNOSIS — L910 Hypertrophic scar: Secondary | ICD-10-CM | POA: Diagnosis not present

## 2019-05-05 NOTE — Telephone Encounter (Signed)
Left message to return call to our office.  

## 2019-05-06 NOTE — Telephone Encounter (Signed)
Left message to return call to our office.  Letter sent in my chart

## 2019-05-06 NOTE — Telephone Encounter (Signed)
Left message to return call to our office.  

## 2019-05-11 ENCOUNTER — Other Ambulatory Visit: Payer: Medicare Other

## 2019-05-15 ENCOUNTER — Ambulatory Visit
Admission: RE | Admit: 2019-05-15 | Discharge: 2019-05-15 | Disposition: A | Discharge status: Home / Self Care | Payer: Medicare Other | Source: Ambulatory Visit | Attending: Family Medicine | Admitting: Family Medicine

## 2019-05-15 ENCOUNTER — Other Ambulatory Visit: Payer: Self-pay

## 2019-05-15 DIAGNOSIS — R600 Localized edema: Secondary | ICD-10-CM

## 2019-05-15 DIAGNOSIS — M729 Fibroblastic disorder, unspecified: Secondary | ICD-10-CM

## 2019-05-15 DIAGNOSIS — R221 Localized swelling, mass and lump, neck: Secondary | ICD-10-CM | POA: Diagnosis not present

## 2019-05-15 DIAGNOSIS — L930 Discoid lupus erythematosus: Secondary | ICD-10-CM

## 2019-05-15 MED ORDER — GADOBENATE DIMEGLUMINE 529 MG/ML IV SOLN
16.0000 mL | Freq: Once | INTRAVENOUS | Status: AC | PRN
  Administered 2019-05-15: 18:00:00 16 mL via INTRAVENOUS

## 2019-05-21 DIAGNOSIS — L089 Local infection of the skin and subcutaneous tissue, unspecified: Secondary | ICD-10-CM | POA: Diagnosis not present

## 2019-05-28 DIAGNOSIS — K1120 Sialoadenitis, unspecified: Secondary | ICD-10-CM | POA: Diagnosis not present

## 2019-05-28 DIAGNOSIS — R600 Localized edema: Secondary | ICD-10-CM | POA: Diagnosis not present

## 2019-05-28 DIAGNOSIS — M724 Pseudosarcomatous fibromatosis: Secondary | ICD-10-CM | POA: Diagnosis not present

## 2019-07-18 ENCOUNTER — Other Ambulatory Visit: Payer: Self-pay

## 2019-07-18 ENCOUNTER — Telehealth: Payer: Self-pay | Admitting: Family Medicine

## 2019-07-18 MED ORDER — SILDENAFIL CITRATE 20 MG PO TABS: 20.0000 mg | ORAL_TABLET | Freq: Three times a day (TID) | ORAL | 1 refills | Status: DC

## 2019-07-18 NOTE — Telephone Encounter (Signed)
  LAST APPOINTMENT DATE: @@LASTENCT @  NEXT APPOINTMENT DATE:@5 /11/2019  MEDICATION:sildenafil (REVATIO) 20 MG tablet  PHARMACY:  [facility] [address], [place] - [address] Phone:  [phone]  Fax:  [phone]       **Let patient know to contact pharmacy at the end of the day to make sure medication is ready. **  ** Please notify patient to allow 48-72 hours to process**  **Encourage patient to contact the pharmacy for refills or they can request refills through [facility]**  CLINICAL FILLS OUT ALL BELOW:   LAST REFILL:  QTY:  REFILL DATE:    OTHER COMMENTS:    Okay for refill?  Please advise

## 2019-07-18 NOTE — Telephone Encounter (Signed)
Sildenafil sent to patient's pharmacy.

## 2019-10-14 ENCOUNTER — Other Ambulatory Visit: Payer: Self-pay | Admitting: Family Medicine

## 2019-11-05 ENCOUNTER — Encounter: Payer: Medicare Other | Admitting: Family Medicine

## 2019-11-05 DIAGNOSIS — Z0289 Encounter for other administrative examinations: Secondary | ICD-10-CM

## 2019-11-17 ENCOUNTER — Other Ambulatory Visit: Payer: Self-pay | Admitting: Family Medicine

## 2019-11-17 DIAGNOSIS — M47812 Spondylosis without myelopathy or radiculopathy, cervical region: Secondary | ICD-10-CM

## 2019-11-17 NOTE — Telephone Encounter (Signed)
Please review

## 2019-11-18 ENCOUNTER — Other Ambulatory Visit: Payer: Self-pay

## 2019-11-18 NOTE — Telephone Encounter (Signed)
Last refill: 10/15/19 #10, 0 Last OV: 04/04/19 with Dr. Earlene Plater, no showed TOC appt 11/05/19 - rescheduled for 02/20/20

## 2019-12-02 ENCOUNTER — Other Ambulatory Visit: Payer: Self-pay | Admitting: Family Medicine

## 2019-12-02 DIAGNOSIS — M47812 Spondylosis without myelopathy or radiculopathy, cervical region: Secondary | ICD-10-CM

## 2020-01-09 ENCOUNTER — Other Ambulatory Visit: Payer: Self-pay | Admitting: Family Medicine

## 2020-02-10 ENCOUNTER — Ambulatory Visit (INDEPENDENT_AMBULATORY_CARE_PROVIDER_SITE_OTHER): Payer: Medicare Other | Admitting: Family Medicine

## 2020-02-10 ENCOUNTER — Other Ambulatory Visit: Payer: Self-pay

## 2020-02-10 ENCOUNTER — Encounter: Payer: Self-pay | Admitting: Family Medicine

## 2020-02-10 ENCOUNTER — Telehealth: Payer: Self-pay | Admitting: Neurology

## 2020-02-10 VITALS — BP 184/110 | HR 89 | Temp 98.4°F | Ht 70.0 in | Wt 179.2 lb

## 2020-02-10 DIAGNOSIS — M47812 Spondylosis without myelopathy or radiculopathy, cervical region: Secondary | ICD-10-CM | POA: Diagnosis not present

## 2020-02-10 DIAGNOSIS — N529 Male erectile dysfunction, unspecified: Secondary | ICD-10-CM

## 2020-02-10 DIAGNOSIS — R4189 Other symptoms and signs involving cognitive functions and awareness: Secondary | ICD-10-CM | POA: Diagnosis not present

## 2020-02-10 MED ORDER — IBUPROFEN 600 MG PO TABS: 600.0000 mg | ORAL_TABLET | Freq: Four times a day (QID) | ORAL | 1 refills | Status: DC | PRN

## 2020-02-10 MED ORDER — SILDENAFIL CITRATE 20 MG PO TABS: 20.0000 mg | ORAL_TABLET | Freq: Every day | ORAL | 2 refills | Status: AC | PRN

## 2020-02-10 NOTE — Progress Notes (Signed)
Subjective  CC: No chief complaint on file.   HPI: Darren Watson is a 54 y.o. male who presents to [facility] [place] today to establish care with me as a new patient.   He has the following concerns or needs:  54 year old male with complicated past medical history.  I reviewed multiple medical records dating back several years.  He has been seen by multiple otolaryngologist, neurology, primary care.  He presents due to needing paperwork completed for new insurance.  Paperwork includes diagnoses and prognoses.  As noted in his medical record, he fixates on a parotid infection.  His history is vague and convoluted.  He has documented history of neurocognitive defects and possible mental health disorder.  He has been seen by infectious disease and otolaryngology at [facility].  I do believe that he is active infections for that his concerns regarding his parotid gland have anything to do with his memory deficits.  Patient believes he needs a neurosurgeon  He reports erectile dysfunction and requests refills of his sildenafil  He requests refills of ibuprofen for neck pain.  Assessment  1. Cognitive changes   2. Erectile dysfunction, unspecified erectile dysfunction type   3. Spondylosis of cervical region without myelopathy or radiculopathy      Plan   Neurocognitive changes and or a mental health disorder: I try to discuss this with the patient.  I have ordered a referral to neurology but after patient left I see him that he see neurology and that further testing has been declined by neurology in the past.  I did try to explain this to the patient.  I believe a psychiatric referral would be helpful but patient declines.  Unfortunately I am unable to complete his paperwork.  I recommend him having this filled out by neurology or ENT given his concerns.  He will return to me for his physicals and primary care needs.  I refilled his medications.  Follow  up: As scheduled for complete physical Orders Placed This Encounter  Procedures  . Ambulatory referral to Neurology   Meds ordered this encounter  Medications  . ibuprofen (ADVIL) 600 MG tablet    Sig: Take 1 tablet (600 mg total) by mouth every 6 (six) hours as needed.    Dispense:  60 tablet    Refill:  1  . sildenafil (REVATIO) 20 MG tablet    Sig: Take 1-3 tablets (20-60 mg total) by mouth daily as needed.    Dispense:  30 tablet    Refill:  2     Depression screen [facility] 2/9 04/04/2019 03/12/2019  Decreased Interest 3 0  Down, Depressed, Hopeless 3 1  PHQ - 2 Score 6 1  Altered sleeping 3 -  Tired, decreased energy 3 -  Change in appetite 3 -  Feeling bad or failure about yourself  3 -  Trouble concentrating 3 -  Moving slowly or fidgety/restless 3 -  Suicidal thoughts 0 -  PHQ-9 Score 24 -  Difficult doing work/chores Very difficult -    We updated and reviewed the patient's past history in detail and it is documented below.  Patient Active Problem List   Diagnosis Date Noted  . Fasciitis 04/04/2019  . History of orbital floor (blow-out) closed fracture (HCC) 09/24/2018  . Neurocognitive deficits 09/19/2018    Feels that his mind "just goes out." Has a video from 2019, during which time he did a strength maneuver on a pull up bar,  suddenly fell and landed on his back with immediate loss of consciousness. He says that he has done this maneuver many times and that this was a great example of his mind pausing. He has a difficult time remembering how to drive his Darren Watson. He is not able to ballance his checkbook. Trouble with spelling. Cannot cook. He has loss of short-term memory.   Reports history of Neuro Rehab at [facility] x 2 months.    . 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 Small posterior scalp hemorrhage and posterior scalp laceration. No acute intracranial findings. 2. Orbital floor fracture of the left appears old. There  appears to be some extension of orbital fat into the left maxillary sinus. 3. Small mucous retention cyst is present in the right maxillary sinus.  CT 04/18/18:  FINDINGS: Please see concurrent CT of the neck for characterization of findings consistent with left parotiditis.   Calvarium/skull base: No evidence of fracture or destructive lesion. Mastoids and middle ears grossly clear. Orbits: Remote left medial orbital floor blowout fracture with herniation of extraconal fat through the defect. Paranasal sinuses: Imaged portions clear. Brain: No evidence of acute abnormality. No significant white matter disease. No evidence of acute ischemia. No mass effect, acute hemorrhage, or hydrocephalus. No abnormal enhancement to suggest neoplasm, abscess or mass lesion.  DATE OF SERVICE: 08/01/2018  REFERRING PROVIDER: Atilano Median, MD  TREATING ENT: Darren Junker, MD  REASON FOR REFERRAL: Impaired cognition.  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 He states he has been seen by numerous ENTs and ophthalmologists. He was given oral antibiotics for a protracted time, apparently a biopsy was taken of the involved tissue of the left cheek and suggested scar formation. He has been going to some form of rehabilitation. He states that over the past 2 years he has not been able to take care of himself. He states he has been living in a truck for 2 years despite having other vehicles at his home. He could not get the other vehicles inspected etc. He states he lives without heat. He states he has lost about 60  pounds. He states he has not been able to perform his ADLs. He denies any hallucinations. Denies seizures or prior mental health issues.   When asked if he has ever had any significant head injuries, he denies this adamantly. On review of past records, it appears that he has had a documented left orbital blowout fracture since 2008 on CT imaging. He also was seen at [facility], October 23, 2017, when he was brought by EMS for presumed head injury when he fell off of a pull up bar. He was amnestic of the event. He underwent CT imaging of the brain, felt to be normal and remote fractures were noted of C7 and T1 by Radiology report. He has had CT imaging of the neck, December 2017, which suggested left parotiditis. A repeat CT of the neck October 2019 suggested left parotiditis with overlying cellulitis. In October 2019, CT brain revealed the remote appearing left orbital blowout fracture with  normal brain parenchyma. He had a recent CBC in October 2019 with a hemoglobin of 12.9 and MCV of 96. He has recently had negative RPR and HIV. He has not undergone MRI imaging to my [facility] as well as Care Everywhere [facility] and MRI brain has been ordered on January 8th, but has not been performed.  ASSESSMENT: 1. Cognitive dysfunction/memory loss. 2. Spells of decreased attentiveness. 3. Left parotiditis. 4. Discoid lupus erythematosus. 5. Left orbital blowout fracture. 6. Questionable prior closed head injuries.  PLAN: The patient's examination today is limited due to cooperation, but he appears to be oriented appropriately and his speech and conversation is certainly fluent. He appears to ambulate safely in the examination and he is able to use his electronic phone easily and repetitively. I feel his issues are primarily related to a mental health disorder.   I have asked the patient to undergo laboratory workup to look for causes of cognitive dysfunction, which have  not been checked. I have also asked the patient to proceed with the previously ordered MRI brain, I believe this is ordered by his primary or ENT. Check EEG given the history of memory loss and amnesia. Strongly consider psychiatric evaluation and neuropsychological evaluation although I doubt he would cooperate based on my interaction today. Review of his cognitive rehabiliation notes suggests the same type of behavior noted by the therapist. I have not ordered any medications. Await the above studies. At the end of the evaluation, the patient requests that he see another provider. He also states that [facility] would [facility]. Greater than 50% of this 80-minute office visit is spent in counseling and coordination of care with the patient and his significant other and review of internal and external medical records.   . Swelling of left parotid gland 09/19/2018    Per ENT note:  (Outside case, [phone], 6 slides, 06/05/2018) Skin, over left parotid, biopsy - Dermal fibrosis, lymphohistiocytic infiltrate, pigment incontinence and subcutaneous fat necrosis with calcification.  CT Neck/Thyroid 04/18/2018 -findings consistent with left-sided parotiditis and overyling cellulitis. There is no evidence of abscess, sialolith, or obstructing mass. Overall, findings are similiar to prior from 06/30/2016. Scattered enlarged lymph nodes (including enlarged submental nodes), which are nonspecific but likely reactive.   Dx: Left posterior facial skin tenderness and overlying changes consistent with potential nodular fascitis. No obvious adenopathy of abnormalities to the parotid glands or signs of parotid obstruction.   Based on his history and assessment of imaging over the past years, I suspect nodular fascitis as the diagnosis for his skin problems which can be treated with surgery.   However, I explained many times to the patient that his skin/parotid issue is unrelated to  the neurologic issues he is reporting. He does not seem to understand, but his son seems to. The patient continues to ask the same questions and tell the same stories and details and seems very confused and frustrated. I was clear with the patient that this abnormality is unrelated to his cognitive issues that he his reporting and he is best served to see a neurologist for these issues. I am concerned that he may have CTE, which was exacerbated by the fall at the gym last June.   If he wishes to proceed, we can treat these skin issues that are present upon exam with surgical excision. However, this will require anesthesia, which I am hesitant to place him under  until he has an evaluation to rule out neurologic issues, like CTE with neurologist evaluation. I would favor a functional MRI scan. I also expressed that skin condition is not urgent or life threatening.    . 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 No acute intracranial abnormality. 2. Irregularity of the C7 and T1 posterior spinous processes are thought to be from previous trauma and not acute. 3. No other acute abnormality in the cervical spine. 4. Degenerative changes. 5. Disc levels: Posterior disc bulges at C5-6 and C6-7. Small anterior posterior osteophytes at the same levels.   . Discoid lupus erythematosus 04/30/2018    Diagnosed by biopsy 2/09 and then followed by dermatology, with high positive ANA, positive dsDNA. Patient states that he was told later that he did NOT have discoid lupus.    . Enthesopathy 01/04/2017  . Biceps rupture, distal 02/25/2016  . Erectile dysfunction, uses prn Revatio 12/24/2011  . History of tear of ACL (anterior cruciate ligament) 12/24/2011   Health Maintenance  Topic Date Due  . Hepatitis C Screening  Never done  . COVID-19 Vaccine (1) Never done  . HIV Screening  Never done  . COLONOSCOPY  Never done  . TETANUS/TDAP  07/03/2016  . INFLUENZA VACCINE  02/01/2020    Immunization History  Administered Date(s) Administered  . Tdap 07/03/2006   Current Meds  Medication Sig  . diclofenac sodium (VOLTAREN) 1 % GEL Apply 2 g topically 4 (four) times daily.  Marland Kitchen ibuprofen (ADVIL) 600 MG tablet Take 1 tablet (600 mg total) by mouth every 6 (six) hours as needed.  . sildenafil (REVATIO) 20 MG tablet Take 1-3 tablets (20-60 mg total) by mouth daily as needed.  . [DISCONTINUED] ALPRAZolam (XANAX) 0.5 MG tablet TALE ONE HALF TO ONE TABLET BY MOUTH TWICE DAILY AS NEEDED FOR SEVERE ANXIETY  . [DISCONTINUED] ibuprofen (ADVIL) 600 MG tablet TAKE 1 TABLET BY MOUTH EVERY 6 HOURS AS NEEDED FOR MODERATE PAIN  . [DISCONTINUED] sildenafil (REVATIO) 20 MG tablet TAKE 1 TABLET BY MOUTH THREE TIMES DAILY    Allergies: Patient has No Known Allergies. Past Medical History Patient  has no past medical history on file. Past Surgical History Patient  has a past surgical history that includes Knee surgery (Right). Family History: Patient family history is not on file. Social History:  Patient  reports that he has never smoked. He has never used smokeless tobacco. He reports that he does not drink alcohol and does not use drugs.  Review of Systems: Constitutional: negative for fever or malaise Ophthalmic: negative for photophobia, double vision or loss of vision Cardiovascular: negative for chest pain, dyspnea on exertion, or new LE swelling Respiratory: negative for SOB or persistent cough Gastrointestinal: negative for abdominal pain, change in bowel habits or melena Genitourinary: negative for dysuria or gross hematuria Musculoskeletal: negative for new gait disturbance or muscular weakness Integumentary: negative for new or persistent rashes Neurological: negative for TIA or stroke symptoms Psychiatric: negative for SI or delusions Allergic/Immunologic: negative for hives  Patient Care Team    Relationship Specialty Notifications Start End  Helane Rima, DO PCP  - General Family Medicine  09/16/18     Objective  Vitals: BP (!) 184/110   Pulse 89   Temp 98.4 F (36.9 C) (Temporal)   Ht 5\' 10"  (1.778 m)   Wt 179 lb 3.2 oz (81.3 kg)   SpO2 98%   BMI 25.71 kg/m  General:  Well developed, well nourished, no acute  distress  Psych:  Alert and oriented, fixates on his story about the parotid gland. HEENT:  Normocephalic, atraumatic, non-icteric sclera,    Commons side effects, risks, benefits, and alternatives for medications and treatment plan prescribed today were discussed, and the patient expressed understanding of the given instructions. Patient is instructed to call or message via MyChart if he/she has any questions or concerns regarding our treatment plan. No barriers to understanding were identified. We discussed Red Flag symptoms and signs in detail. Patient expressed understanding regarding what to do in case of urgent or emergency type symptoms.   Medication list was reconciled, printed and provided to the patient in AVS. Patient instructions and summary information was reviewed with the patient as documented in the AVS. This note was prepared with assistance of Dragon voice recognition software. Occasional wrong-word or sound-a-like substitutions may have occurred due to the inherent limitations of voice recognition software  This visit occurred during the SARS-CoV-2 public health emergency.  Safety protocols were in place, including screening questions prior to the visit, additional usage of staff PPE, and extensive cleaning of exam room while observing appropriate contact time as indicated for disinfecting solutions.

## 2020-02-10 NOTE — Telephone Encounter (Signed)
Patient was seen at [facility] for this problem, [facility] said there was nothing more they could do for him, and that neuropsychological testing would be needed, but referring office is sending him here for memory loss. Referring office recommended psychiatric referral but patient declined so referral was sent here for memory loss. Can we see patient for this, or what do you advise. Thank you

## 2020-02-10 NOTE — Patient Instructions (Signed)
Please follow up as scheduled for your next visit with me: 04/21/2020   We will call you with information regarding your referral appointment. Neurology to help evaluate your brain concerns.  If you do not hear from [place] within the next 2 weeks, please let me know. It can take 1-2 weeks to get appointments set up with the specialists.   If you have any questions or concerns, please don't hesitate to send me a message via MyChart or call the office at [phone]. Thank you for visiting with [place] today! It's our pleasure caring for you.

## 2020-02-10 NOTE — Telephone Encounter (Signed)
He has seen Neurology at [facility] January 2020 and never followed through with testing.    According to the neurology visit note from [facility], the patient had wanted further evaluation at [facility] or [facility].  We have nothing further to offer that has not already been offered to him.  It might be best for him to be referred back to [facility] or another academic center as he wanted

## 2020-02-17 DIAGNOSIS — H18413 Arcus senilis, bilateral: Secondary | ICD-10-CM | POA: Diagnosis not present

## 2020-02-17 DIAGNOSIS — H524 Presbyopia: Secondary | ICD-10-CM | POA: Diagnosis not present

## 2020-02-20 ENCOUNTER — Encounter: Payer: Medicare Other | Admitting: Family Medicine

## 2020-03-24 ENCOUNTER — Telehealth: Payer: Self-pay | Admitting: Family Medicine

## 2020-03-24 NOTE — Telephone Encounter (Signed)
Left message for patient to call back and schedule Medicare Annual Wellness Visit (AWV) either virtually/audio only OR in office. Whatever the patients preference is.  No hx; please schedule at anytime with [facility]Nurse Health Advisor at [facility].  This should be a 45 minute visit.  AWV-I PER [place] AS OF 07/03/12

## 2020-04-08 DIAGNOSIS — M1711 Unilateral primary osteoarthritis, right knee: Secondary | ICD-10-CM | POA: Diagnosis not present

## 2020-04-08 DIAGNOSIS — M25461 Effusion, right knee: Secondary | ICD-10-CM | POA: Diagnosis not present

## 2020-04-08 DIAGNOSIS — M25561 Pain in right knee: Secondary | ICD-10-CM | POA: Diagnosis not present

## 2020-04-16 ENCOUNTER — Other Ambulatory Visit: Payer: Self-pay | Admitting: Family Medicine

## 2020-04-16 DIAGNOSIS — M47812 Spondylosis without myelopathy or radiculopathy, cervical region: Secondary | ICD-10-CM

## 2020-04-21 ENCOUNTER — Encounter: Payer: Medicare Other | Admitting: Family Medicine

## 2020-05-13 ENCOUNTER — Other Ambulatory Visit: Payer: Self-pay | Admitting: Family Medicine

## 2020-05-13 DIAGNOSIS — M1711 Unilateral primary osteoarthritis, right knee: Secondary | ICD-10-CM | POA: Diagnosis not present

## 2020-05-13 DIAGNOSIS — M47812 Spondylosis without myelopathy or radiculopathy, cervical region: Secondary | ICD-10-CM

## 2020-05-13 DIAGNOSIS — Z87828 Personal history of other (healed) physical injury and trauma: Secondary | ICD-10-CM | POA: Diagnosis not present

## 2020-05-21 ENCOUNTER — Other Ambulatory Visit: Payer: Self-pay | Admitting: Family Medicine

## 2020-05-21 DIAGNOSIS — M47812 Spondylosis without myelopathy or radiculopathy, cervical region: Secondary | ICD-10-CM

## 2020-06-11 ENCOUNTER — Other Ambulatory Visit: Payer: Self-pay | Admitting: Family Medicine

## 2020-06-11 DIAGNOSIS — M47812 Spondylosis without myelopathy or radiculopathy, cervical region: Secondary | ICD-10-CM

## 2020-06-18 ENCOUNTER — Other Ambulatory Visit: Payer: Self-pay | Admitting: Family Medicine

## 2020-06-18 ENCOUNTER — Telehealth: Payer: Self-pay

## 2020-06-18 DIAGNOSIS — M47812 Spondylosis without myelopathy or radiculopathy, cervical region: Secondary | ICD-10-CM

## 2020-06-18 NOTE — Telephone Encounter (Signed)
Patient no showed TOC appt and is restricted from scheduling future appts. I am unable to refill any medications as he is not under Dr. Modesta Messing care.

## 2020-06-18 NOTE — Telephone Encounter (Signed)
  LAST APPOINTMENT DATE: 02/10/2020   NEXT APPOINTMENT DATE:  MEDICATION:ibuprofen (ADVIL) 600 MG tablet  sildenafil (REVATIO) 20 MG tablet   PHARMACY: [facility] [address], [place] - [address]    OTHER COMMENTS: Requesting the coating on the medication.    Patient has no showed and canceled future TOC appointments. And is restricted from scheduling future appts.   Patient would like to schedule TOC appt.

## 2020-06-22 ENCOUNTER — Telehealth: Payer: Self-pay

## 2020-06-22 NOTE — Telephone Encounter (Signed)
erorr

## 2020-06-22 NOTE — Telephone Encounter (Signed)
Left detailed message regarding medication and TOC appt.

## 2020-06-23 ENCOUNTER — Telehealth: Payer: Self-pay

## 2020-06-23 NOTE — Telephone Encounter (Signed)
error 

## 2020-06-28 DIAGNOSIS — M1711 Unilateral primary osteoarthritis, right knee: Secondary | ICD-10-CM | POA: Diagnosis not present

## 2021-05-11 DIAGNOSIS — R202 Paresthesia of skin: Secondary | ICD-10-CM | POA: Diagnosis not present

## 2021-05-11 DIAGNOSIS — R030 Elevated blood-pressure reading, without diagnosis of hypertension: Secondary | ICD-10-CM | POA: Diagnosis not present

## 2021-05-11 DIAGNOSIS — G8929 Other chronic pain: Secondary | ICD-10-CM | POA: Diagnosis not present

## 2021-05-11 DIAGNOSIS — M25561 Pain in right knee: Secondary | ICD-10-CM | POA: Diagnosis not present

## 2021-05-11 DIAGNOSIS — R200 Anesthesia of skin: Secondary | ICD-10-CM | POA: Diagnosis not present

## 2021-05-11 DIAGNOSIS — Z87828 Personal history of other (healed) physical injury and trauma: Secondary | ICD-10-CM | POA: Diagnosis not present

## 2021-07-10 IMAGING — MR MR NECK SOFT TISSUE ONLY WO/W CM
5 of 10 series · 22 of 48 positions shown · IV contrast (16ml Multihance)
Comparison: None.

CLINICAL DATA: Swelling of the left parotid gland for 3 years.
Fasciitis. Fibroblastic disorder. Discoid lupus.

EXAM:
MRI OF THE NECK WITH CONTRAST
TECHNIQUE: Multiplanar, multisequence MR imaging was performed following the
administration of intravenous contrast.
CONTRAST:  16mL MULTIHANCE GADOBENATE DIMEGLUMINE 529 MG/ML IV SOLN

[Series 4: T2 fat-sat · coronal · 4.0mm · 0.45mm/px · 4 of 30 slices shown (1 of 3)]
[im 1/30]
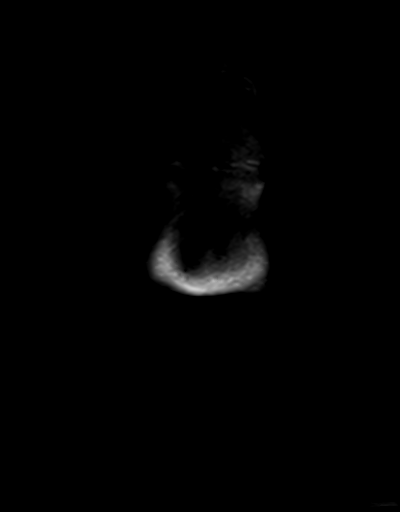
[im 10/30]
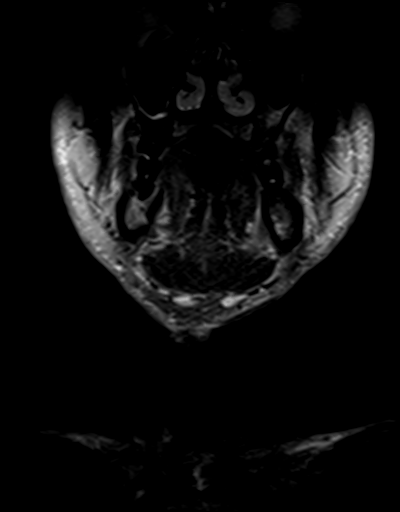
[im 20/30]
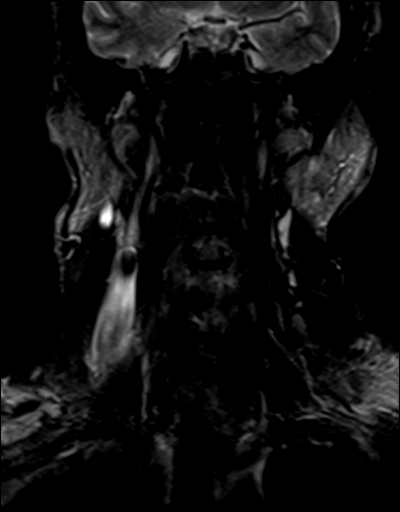
[im 30/30]
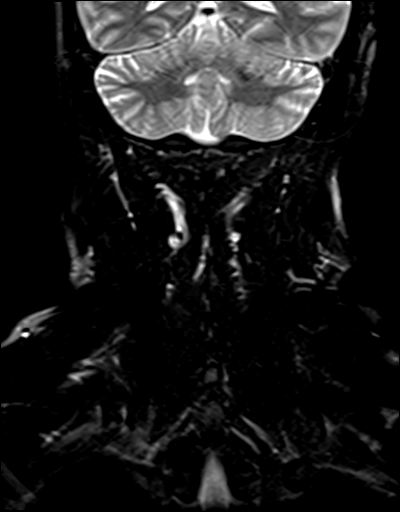

[Series 5: T1 · sagittal · 5.0mm · 0.45mm/px · 5 of 30 slices shown]
[im 1/30]
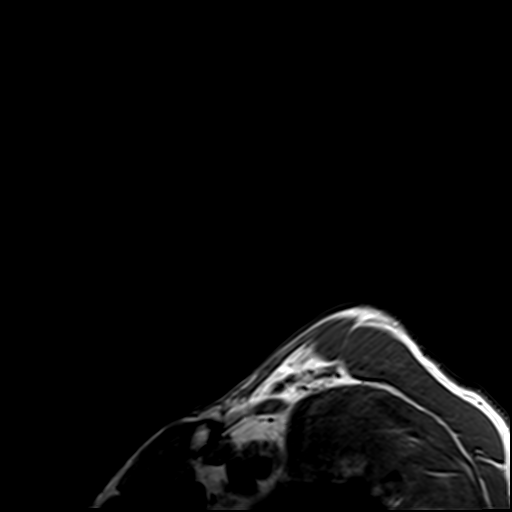
[im 8/30]
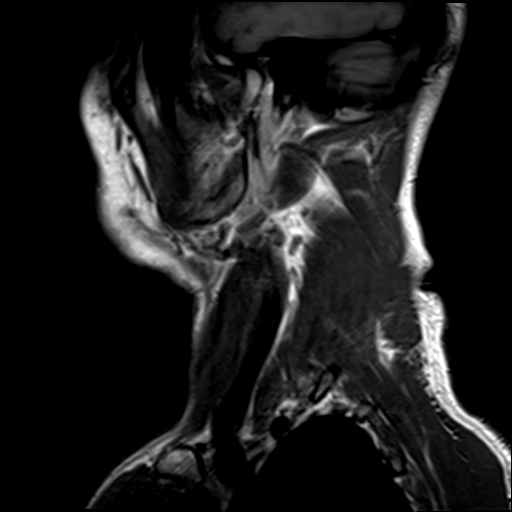
[im 15/30]
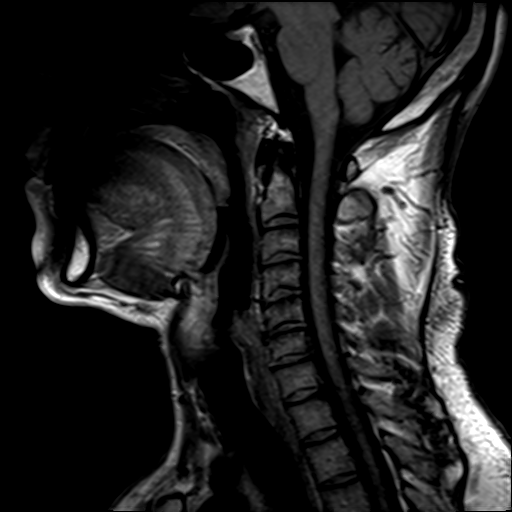
[im 22/30]
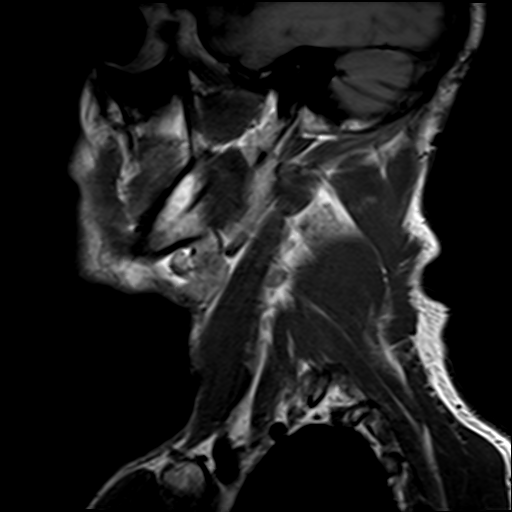
[im 30/30]
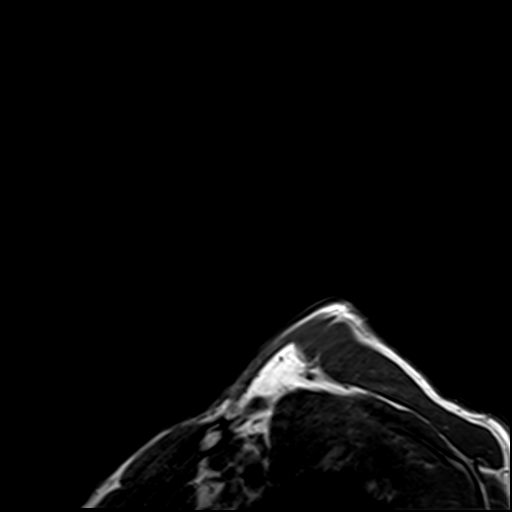

[Series 8: T2 fat-sat · axial · 5.0mm · 0.43mm/px · z∈[-59,+148]mm · 5 of 33 slices shown (2 of 3)]
[im 1/33]
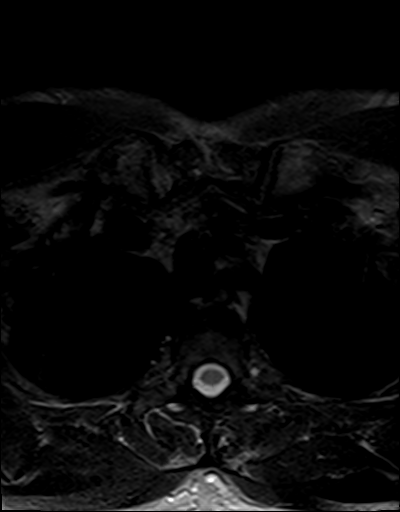
[im 9/33]
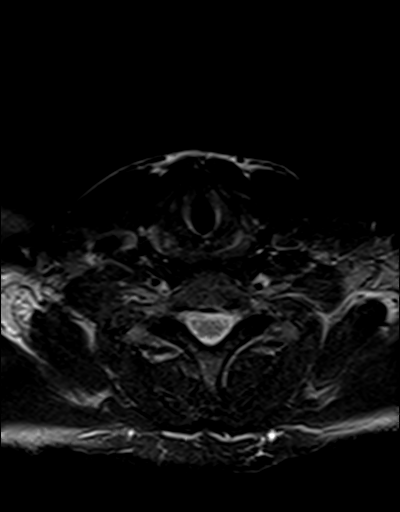
[im 17/33]
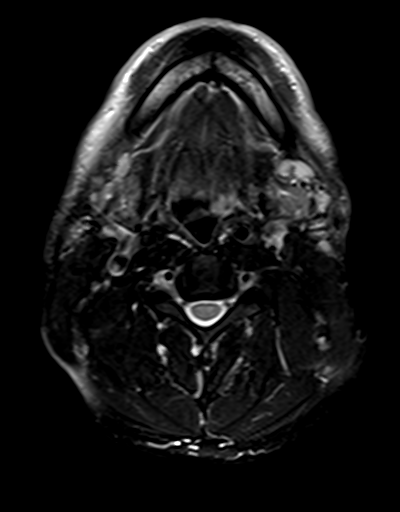
[im 25/33]
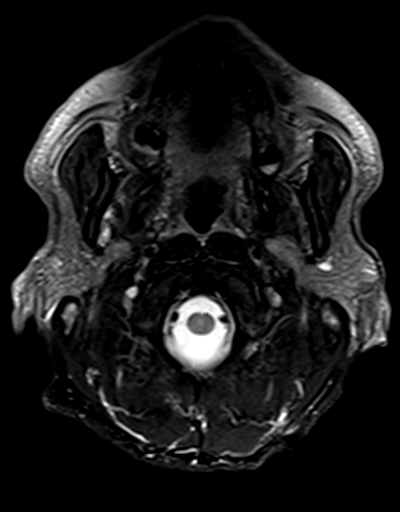
[im 33/33]
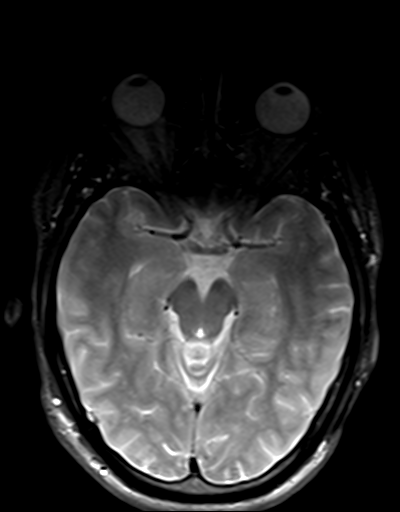

[Series 10: T2 fat-sat · sagittal · 5.0mm · 0.60mm/px · 5 of 30 slices shown (3 of 3)]
[im 1/30]
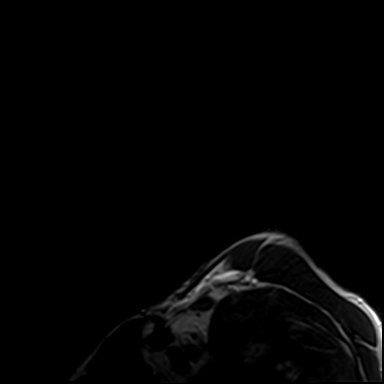
[im 8/30]
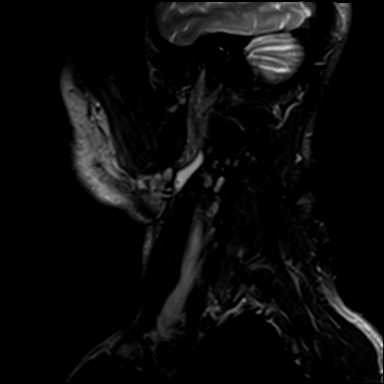
[im 15/30]
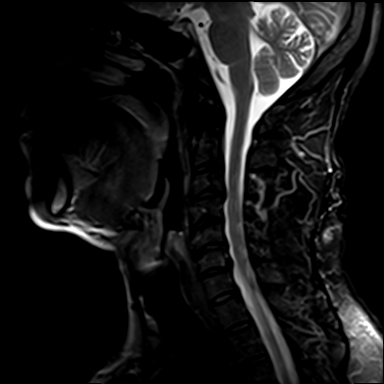
[im 22/30]
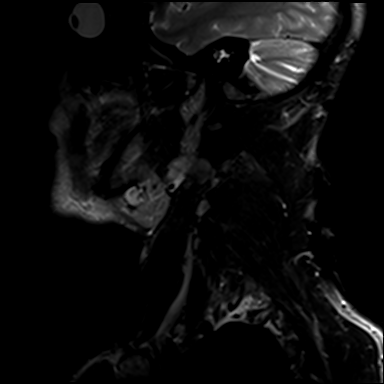
[im 30/30]
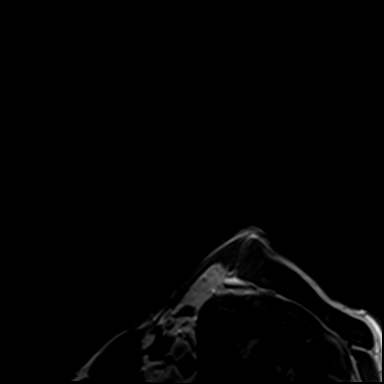

[Series 14: T1 fat-sat post-contrast · sagittal · 5.0mm · 0.45mm/px · 3 of 30 slices shown]
[im 1/30]
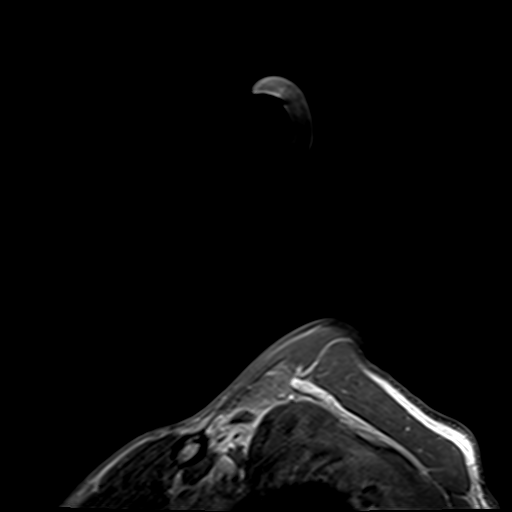
[im 8/30]
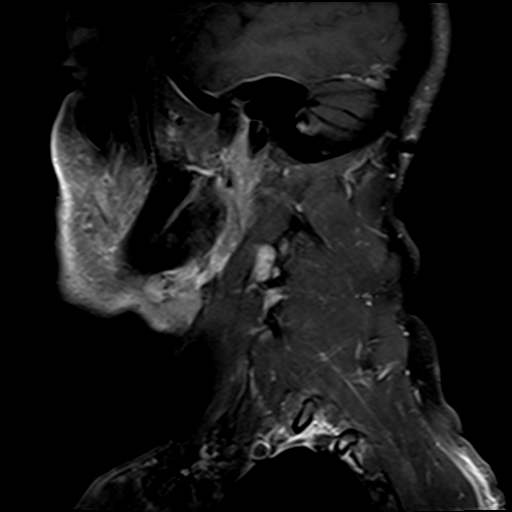
[im 15/30]
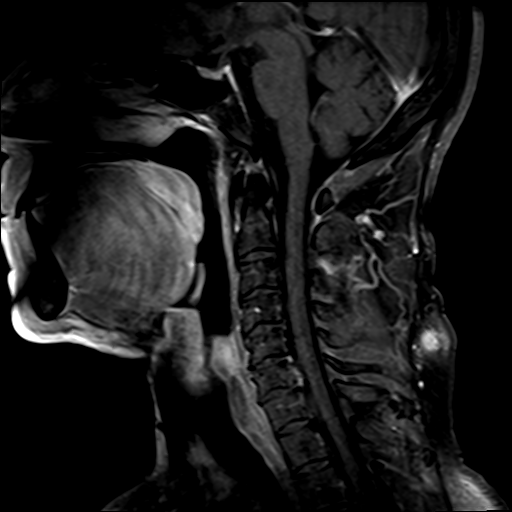

[22 of 48 positions shown; findings below may reference images not displayed]

FINDINGS: Pharynx and larynx: No mucosal or submucosal lesion.

Salivary glands: Submandibular glands are normal and symmetric.
Parotid glands appear intrinsically normal, showing normal internal
architecture, normal signal characteristics and a normal appearance
following contrast enhancement. The left gland is slightly larger
than the left, particularly along the inferior portion, with a
convex outer margin compared with the concave outer margin of the
gland on the right. The tissue does not appear intrinsically
pathologic and this could be normal variation or there could
actually be some chronic volume loss on the right given the
impression enlargement on left.

Thyroid: Normal

Lymph nodes: No enlarged or abnormal lymph nodes in the anterior
neck. Within the subcutaneous fat of the posterior midline at about
the C5 level, there is a lymph node within the subcutaneous fat that
appears slightly inflamed. Usually this is reactive to folliculitis.
As an isolated finding, this is not of concern.

Vascular: Abnormal vascular finding.

Limited intracranial: Normal

Visualized orbits: Normal

Mastoids and visualized paranasal sinuses: Normal

Skeleton: Ordinary mild cervical spondylosis typical for age.

Upper chest: Negative

Other: None
IMPRESSION: Submandibular glands are normal. The parotid glands do show
asymmetry from right to left, with the lateral margin of the left
parotid gland being convex and the lateral margin of the right
parotid gland being concave. The intrinsic MR appearance of the
tissue does not appear abnormal, with normal internal architecture ,
normal signal characteristics and no abnormal contrast enhancement.
Therefore, this could be a normal variation with the left parotid
gland being larger than the right or there could actually be some
volume loss within the parotid gland on the right. If there is some
sort of hypertrophic process on the left, it is not distinguishable
from normal parotid tissue.

## 2021-10-08 ENCOUNTER — Other Ambulatory Visit: Payer: Self-pay

## 2021-10-08 ENCOUNTER — Emergency Department (HOSPITAL_BASED_OUTPATIENT_CLINIC_OR_DEPARTMENT_OTHER): Payer: Medicare Other

## 2021-10-08 ENCOUNTER — Emergency Department (HOSPITAL_BASED_OUTPATIENT_CLINIC_OR_DEPARTMENT_OTHER)
Admission: EM | Admit: 2021-10-08 | Discharge: 2021-10-08 | Discharge status: Against Medical Advice | Payer: Medicare Other | Attending: Emergency Medicine | Admitting: Emergency Medicine

## 2021-10-08 ENCOUNTER — Encounter (HOSPITAL_BASED_OUTPATIENT_CLINIC_OR_DEPARTMENT_OTHER): Payer: Self-pay | Admitting: Emergency Medicine

## 2021-10-08 DIAGNOSIS — W57XXXA Bitten or stung by nonvenomous insect and other nonvenomous arthropods, initial encounter: Secondary | ICD-10-CM | POA: Insufficient documentation

## 2021-10-08 DIAGNOSIS — R079 Chest pain, unspecified: Secondary | ICD-10-CM | POA: Insufficient documentation

## 2021-10-08 DIAGNOSIS — R0602 Shortness of breath: Secondary | ICD-10-CM | POA: Diagnosis not present

## 2021-10-08 DIAGNOSIS — M542 Cervicalgia: Secondary | ICD-10-CM | POA: Insufficient documentation

## 2021-10-08 DIAGNOSIS — R42 Dizziness and giddiness: Secondary | ICD-10-CM | POA: Insufficient documentation

## 2021-10-08 DIAGNOSIS — S20364A Insect bite (nonvenomous) of middle front wall of thorax, initial encounter: Secondary | ICD-10-CM | POA: Diagnosis not present

## 2021-10-08 DIAGNOSIS — S20461A Insect bite (nonvenomous) of right back wall of thorax, initial encounter: Secondary | ICD-10-CM | POA: Insufficient documentation

## 2021-10-08 DIAGNOSIS — R21 Rash and other nonspecific skin eruption: Secondary | ICD-10-CM | POA: Insufficient documentation

## 2021-10-08 LAB — BASIC METABOLIC PANEL
Anion gap: 9 (ref 5–15)
BUN: 13 mg/dL (ref 6–20)
CO2: 28 mmol/L (ref 22–32)
Calcium: 9.6 mg/dL (ref 8.9–10.3)
Chloride: 102 mmol/L (ref 98–111)
Creatinine, Ser: 1.15 mg/dL (ref 0.61–1.24)
GFR, Estimated: >60 mL/min (ref >60)
Glucose, Bld: 120 mg/dL — ABNORMAL HIGH (ref 70–99)
Potassium: 4 mmol/L (ref 3.5–5.1)
Sodium: 139 mmol/L (ref 135–145)

## 2021-10-08 LAB — CBC WITH DIFFERENTIAL/PLATELET
Abs Immature Granulocytes: 0.02 10*3/uL (ref 0.00–0.07)
Basophils Absolute: 0 10*3/uL (ref 0.0–0.1)
Basophils Relative: 1 %
Eosinophils Absolute: 0.2 10*3/uL (ref 0.0–0.5)
Eosinophils Relative: 3 %
HCT: 41.9 % (ref 39.0–52.0)
Hemoglobin: 13.7 g/dL (ref 13.0–17.0)
Immature Granulocytes: 0 %
Lymphocytes Relative: 38 %
Lymphs Abs: 2.8 10*3/uL (ref 0.7–4.0)
MCH: 30.4 pg (ref 26.0–34.0)
MCHC: 32.7 g/dL (ref 30.0–36.0)
MCV: 93.1 fL (ref 80.0–100.0)
Monocytes Absolute: 0.8 10*3/uL (ref 0.1–1.0)
Monocytes Relative: 10 %
Neutro Abs: 3.5 10*3/uL (ref 1.7–7.7)
Neutrophils Relative %: 48 %
Platelets: 304 10*3/uL (ref 150–400)
RBC: 4.5 MIL/uL (ref 4.22–5.81)
RDW: 13 % (ref 11.5–15.5)
WBC: 7.3 10*3/uL (ref 4.0–10.5)
nRBC: 0 % (ref 0.0–0.2)

## 2021-10-08 LAB — MAGNESIUM: Magnesium: 2 mg/dL (ref 1.7–2.4)

## 2021-10-08 LAB — TROPONIN I (HIGH SENSITIVITY)
Troponin I (High Sensitivity): 5 ng/L (ref <18)
Troponin I (High Sensitivity): 5 ng/L (ref <18)

## 2021-10-08 MED ORDER — IOHEXOL 350 MG/ML SOLN
100.0000 mL | Freq: Once | INTRAVENOUS | Status: DC | PRN
Start: 2021-10-08 — End: 2021-10-08

## 2021-10-08 MED ORDER — SODIUM CHLORIDE 0.9 % IV BOLUS
1000.0000 mL | Freq: Once | INTRAVENOUS | Status: AC
Start: 2021-10-08 — End: 2021-10-08
  Administered 2021-10-08: 1000 mL via INTRAVENOUS

## 2021-10-08 MED ORDER — MECLIZINE HCL 25 MG PO TABS
25.0000 mg | ORAL_TABLET | Freq: Once | ORAL | Status: AC
  Administered 2021-10-08: 25 mg via ORAL
  Filled 2021-10-08: qty 1

## 2021-10-08 MED ORDER — MECLIZINE HCL 12.5 MG PO TABS: 12.5000 mg | ORAL_TABLET | Freq: Three times a day (TID) | ORAL | 0 refills | Status: AC | PRN

## 2021-10-08 MED ORDER — DOXYCYCLINE HYCLATE 100 MG PO CAPS: 200.0000 mg | ORAL_CAPSULE | Freq: Once | ORAL | 0 refills | Status: DC

## 2021-10-08 MED ORDER — LORAZEPAM 1 MG PO TABS
1.0000 mg | ORAL_TABLET | Freq: Once | ORAL | Status: AC
  Administered 2021-10-08: 1 mg via ORAL
  Filled 2021-10-08: qty 1

## 2021-10-08 MED ORDER — DOXYCYCLINE HYCLATE 100 MG PO CAPS: 200.0000 mg | ORAL_CAPSULE | Freq: Once | ORAL | 0 refills | Status: AC

## 2021-10-08 NOTE — Discharge Instructions (Signed)
Today you were recommended to have an MRI to rule out a stroke due to your dizziness, neck pain, and difficulty ambulating. Please return to ED if you change your mind. We would be more than happy to see you again for re-evaluation and MRI if symptoms return/worsen.  ?

## 2021-10-08 NOTE — ED Provider Notes (Signed)
?[facility] ?Provider Note ? ? ?CSN: [id number] ?Arrival date & time: 10/08/21  0656 ? ?  ? ?History ? ?Chief Complaint  ?Patient presents with  ? Shortness of Breath  ? Dizziness  ? ? ?Rod MaeWilliam H Clagett is a 56 y.o. male. ? ?Patient is a 56 yo male with no significant pmh presenting for dizziness. Pt states he awoke this morning and had profound dizziness resulting in difficulty standing/walking.  States dizziness is worse with head movement/standing. Describes it as "feeling like I'm drunk" with associated nausea. Denies abdominal discomfort. Admits to right sided lateral visual field changes "like I can't see as well over here".  Admits to sternal chest pain that also occurred this morning, no radiation, lasted minutes, no resolved.  ? ?Pt also presenting for evaluation for tick bite. Seen by pcp for tick bite and started on doxycyline but no longer taking and has multiple new tick bites.  ? ?The history is provided by the patient. No language interpreter was used.  ?Shortness of Breath ?Associated symptoms: chest pain and rash   ?Associated symptoms: no abdominal pain, no cough, no ear pain, no fever, no sore throat and no vomiting   ?Dizziness ?Associated symptoms: chest pain   ?Associated symptoms: no palpitations, no shortness of breath and no vomiting   ? ?  ? ?Home Medications ?Prior to Admission medications   ?Medication Sig Start Date End Date Taking? Authorizing Provider  ?diclofenac sodium (VOLTAREN) 1 % GEL Apply 2 g topically 4 (four) times daily. 01/22/19   Helane RimaWallace, Erica, DO  ?ibuprofen (ADVIL) 600 MG tablet TAKE 1 TABLET BY MOUTH EVERY 6 HOURS AS NEEDED 04/18/20   Willow OraAndy, Camille L, MD  ?sildenafil (REVATIO) 20 MG tablet Take 1-3 tablets (20-60 mg total) by mouth daily as needed. 02/10/20   Willow OraAndy, Camille L, MD  ?   ? ?Allergies    ?Patient has no known allergies.   ? ?Review of Systems   ?Review of Systems  ?Constitutional:  Negative for chills and fever.  ?HENT:  Negative for  ear pain and sore throat.   ?Eyes:  Positive for visual disturbance. Negative for pain.  ?Respiratory:  Negative for cough and shortness of breath.   ?Cardiovascular:  Positive for chest pain. Negative for palpitations.  ?Gastrointestinal:  Negative for abdominal pain and vomiting.  ?Genitourinary:  Negative for dysuria and hematuria.  ?Musculoskeletal:  Negative for arthralgias and back pain.  ?Skin:  Positive for rash. Negative for color change.  ?Neurological:  Positive for dizziness. Negative for seizures and syncope.  ?All other systems reviewed and are negative. ? ?Physical Exam ?Updated Vital Signs ?BP (!) 152/79 (BP Location: Left Arm)   Pulse 61   Temp 98.2 ?F (36.8 ?C) (Oral)   Resp 17   Ht 5\' 9"  (1.753 m)   Wt 90.7 kg   SpO2 100%   BMI 29.53 kg/m?  ?Physical Exam ?Vitals and nursing note reviewed.  ?Constitutional:   ?   General: He is not in acute distress. ?   Appearance: He is well-developed.  ?HENT:  ?   Head: Normocephalic and atraumatic.  ?Eyes:  ?   General: Lids are normal. Vision grossly intact.  ?   Conjunctiva/sclera: Conjunctivae normal.  ?   Pupils: Pupils are equal, round, and reactive to light.  ?   Visual Fields: Right eye visual fields normal and left eye visual fields normal.  ?Cardiovascular:  ?   Rate and Rhythm: Normal rate and regular rhythm.  ?  Heart sounds: No murmur heard. ?Pulmonary:  ?   Effort: Pulmonary effort is normal. No respiratory distress.  ?   Breath sounds: Normal breath sounds.  ?Abdominal:  ?   Palpations: Abdomen is soft.  ?   Tenderness: There is no abdominal tenderness.  ?Musculoskeletal:     ?   General: No swelling.  ?   Cervical back: Neck supple.  ?Skin: ?   General: Skin is warm and dry.  ?   Capillary Refill: Capillary refill takes less than 2 seconds.  ? ?    ?Neurological:  ?   General: No focal deficit present.  ?   Mental Status: He is alert and oriented to person, place, and time.  ?   GCS: GCS eye subscore is 4. GCS verbal subscore is 5. GCS  motor subscore is 6.  ?   Cranial Nerves: Cranial nerves 2-12 are intact.  ?   Sensory: Sensation is intact.  ?   Motor: Motor function is intact.  ?   Coordination: Coordination is intact.  ?   Gait: Gait is intact.  ?Psychiatric:     ?   Mood and Affect: Mood normal.  ? ? ?ED Results / Procedures / Treatments   ?Labs ?(all labs ordered are listed, but only abnormal results are displayed) ?Labs Reviewed  ?CBC WITH DIFFERENTIAL/PLATELET  ?BASIC METABOLIC PANEL  ?MAGNESIUM  ?TROPONIN I (HIGH SENSITIVITY)  ? ? ?EKG ?None ? ?Radiology ?No results found. ? ?Procedures ?Procedures  ? ? ?Medications Ordered in ED ?Medications  ?sodium chloride 0.9 % bolus 1,000 mL (1,000 mLs Intravenous New Bag/Given 10/08/21 0801)  ?meclizine (ANTIVERT) tablet 25 mg (25 mg Oral Given 10/08/21 0802)  ? ? ?ED Course/ Medical Decision Making/ A&P ?  ?                        ?Medical Decision Making ?Amount and/or Complexity of Data Reviewed ?Labs: ordered. ?Radiology: ordered. ? ?Risk ?Prescription drug management. ? ? ?8:12 AM ?56 yo male with no significant pmh presenting for dizziness and chest pain. Pt is AO x 3, no acute distress, afebrile, with stable vitals. Physical exam demonstrates no slurred speech, no facial asymmetry, no neurovascular deficits. ?Time of onset: unknown ?Last known well :9 PM the night before ? ?Patient declining CT head.  ?Patient offered transfer for MRI and declining.  ?I explained the significance of reported severe dizziness, vision changes, and difficulty ambulating and the inability to rule out a stroke or vertebral artery dissection without imaging. Patient states "I understand. I would know if it was a stroke. I found out my mom passed at 3AM this morning and I think it was more anxiety. I just want some IV fluids because I think I am dehydrated". Wife at bedside, also understands risks stating "He is a grown man". ? ?I independently interpreted patient's labs and EKG. EKG demonstrates no ST segment  elevation or depression. Stable intervals and rate. Troponin wnl. Stable electrolytes and renal function.  ? ?The patient has requested to leave the ED against medical advice. I believe this patient is of sound mind and medical decision making capacity to refuse medical care. The patient is responding and asking questions appropriately. The patient is oriented to person, place and time. The patient is not psychotic, delusional, suicidal, homicidal or hallucinating. The patient demonstrates a normal mental capacity to make decisions regarding their healthcare. The patient is clinically sober and does not appear to be under  the influence of any illicit drugs at this time. The patient has been advised of the risks, in layman terms, of leaving AMA which include, but are not limited to death, coma, permanent disability, loss of current lifestyle, delay in diagnosis. Alternatives have been offered - the patient remains steadfast in their wish to leave. The patient has been advised that should they change their mind they are welcome to return to this hospital, or any other, at any time. The patient understands that in no way does an AMA discharge mean that I do not want them to have the best medical care available. To this end, I have offered appropriate prescriptions, referrals, and discharge instructions. The patient did sign AMA paperwork. The above discussion was witnessed by another member of staff. ? ?Doxycyline 200 mg orally given for lyme disease prophylaxis from tick bites.  ? ? ? ? ? ? ? ?Final Clinical Impression(s) / ED Diagnoses ?Final diagnoses:  ?Dizziness  ?Neck pain  ?Chest pain, unspecified type  ?Tick bite, unspecified site, initial encounter  ? ? ?Rx / DC Orders ?ED Discharge Orders   ? ? None  ? ?  ? ? ?  ?Franne Forts, DO ?10/09/21 0741 ? ?

## 2021-10-08 NOTE — ED Notes (Signed)
Pt refusing CT Head at this time, states that is not his concern, just feels dehydrated. Provider aware. ?

## 2021-10-08 NOTE — ED Notes (Addendum)
AMA form signed by Pt.  ?

## 2021-10-08 NOTE — ED Notes (Signed)
Pt does not want further treatment. Ready to leave at this time, Provider at bedside to discuss risks.  ?

## 2021-10-08 NOTE — ED Triage Notes (Signed)
Pt sts that he felt bad last night and worse this morning when he woke up, sts that he is dizzy and does not feel normal. Sts that when he was driving he was having vision changes and thought he BP was high, denies having bp problems, denies significant medical hx. Pt is dealing with personal issues but sts that this could be dehydration/stress as well. Multiple bug bites noted on pt. ?
